# Patient Record
Sex: Male | Born: 1965 | Race: Black or African American | Hispanic: No | Marital: Single | Smoking: Never smoker
Health system: Southern US, Community
[De-identification: ages and names within clinical notes are randomized; demographics above are authoritative.]

## PROBLEM LIST (undated history)

## (undated) DIAGNOSIS — I10 Essential (primary) hypertension: Secondary | ICD-10-CM

## (undated) DIAGNOSIS — M109 Gout, unspecified: Secondary | ICD-10-CM

## (undated) HISTORY — PX: GALLBLADDER SURGERY: SHX652

---

## 2010-12-17 ENCOUNTER — Inpatient Hospital Stay (HOSPITAL_COMMUNITY)
Admission: EM | Admit: 2010-12-17 | Discharge: 2010-12-20 | DRG: 554 | Disposition: A | Discharge status: Home / Self Care | Payer: Self-pay | Attending: Internal Medicine | Admitting: Internal Medicine

## 2010-12-17 DIAGNOSIS — E871 Hypo-osmolality and hyponatremia: Secondary | ICD-10-CM | POA: Diagnosis not present

## 2010-12-17 DIAGNOSIS — R791 Abnormal coagulation profile: Secondary | ICD-10-CM | POA: Diagnosis present

## 2010-12-17 DIAGNOSIS — E119 Type 2 diabetes mellitus without complications: Secondary | ICD-10-CM | POA: Diagnosis present

## 2010-12-17 DIAGNOSIS — M109 Gout, unspecified: Principal | ICD-10-CM | POA: Diagnosis present

## 2010-12-17 DIAGNOSIS — D509 Iron deficiency anemia, unspecified: Secondary | ICD-10-CM | POA: Diagnosis present

## 2010-12-17 DIAGNOSIS — K449 Diaphragmatic hernia without obstruction or gangrene: Secondary | ICD-10-CM | POA: Diagnosis present

## 2010-12-17 DIAGNOSIS — Z9889 Other specified postprocedural states: Secondary | ICD-10-CM

## 2010-12-18 LAB — BASIC METABOLIC PANEL
BUN: 9 mg/dL (ref 6–23)
CO2: 26 mEq/L (ref 19–32)
Chloride: 100 mEq/L (ref 96–112)
Creatinine, Ser: 1 mg/dL (ref 0.50–1.35)
Glucose, Bld: 162 mg/dL — ABNORMAL HIGH (ref 70–99)

## 2010-12-18 LAB — CBC
HCT: 35.4 % — ABNORMAL LOW (ref 39.0–52.0)
HCT: 35.6 % — ABNORMAL LOW (ref 39.0–52.0)
Hemoglobin: 11.7 g/dL — ABNORMAL LOW (ref 13.0–17.0)
Hemoglobin: 11.8 g/dL — ABNORMAL LOW (ref 13.0–17.0)
MCH: 27.1 pg (ref 26.0–34.0)
MCH: 27.1 pg (ref 26.0–34.0)
MCV: 81.7 fL (ref 78.0–100.0)
MCV: 81.9 fL (ref 78.0–100.0)
RBC: 4.32 MIL/uL (ref 4.22–5.81)
RBC: 4.36 MIL/uL (ref 4.22–5.81)
WBC: 8.5 10*3/uL (ref 4.0–10.5)

## 2010-12-18 LAB — D-DIMER, QUANTITATIVE: D-Dimer, Quant: 1.5 ug/mL-FEU — ABNORMAL HIGH (ref 0.00–0.48)

## 2010-12-18 LAB — POCT I-STAT, CHEM 8
BUN: 10 mg/dL (ref 6–23)
Chloride: 104 mEq/L (ref 96–112)
Creatinine, Ser: 1.2 mg/dL (ref 0.50–1.35)
Sodium: 138 mEq/L (ref 135–145)

## 2010-12-18 LAB — DIFFERENTIAL
Lymphocytes Relative: 39 % (ref 12–46)
Lymphs Abs: 3.3 10*3/uL (ref 0.7–4.0)
Monocytes Relative: 8 % (ref 3–12)
Neutrophils Relative %: 50 % (ref 43–77)

## 2010-12-18 LAB — HEMOGLOBIN A1C: Hgb A1c MFr Bld: 6.7 % — ABNORMAL HIGH (ref <5.7)

## 2010-12-18 LAB — MRSA PCR SCREENING: MRSA by PCR: NEGATIVE

## 2010-12-19 DIAGNOSIS — M79609 Pain in unspecified limb: Secondary | ICD-10-CM

## 2010-12-19 LAB — DIFFERENTIAL
Basophils Absolute: 0 10*3/uL (ref 0.0–0.1)
Lymphocytes Relative: 48 % — ABNORMAL HIGH (ref 12–46)
Lymphs Abs: 3.8 10*3/uL (ref 0.7–4.0)
Monocytes Absolute: 0.6 10*3/uL (ref 0.1–1.0)
Neutro Abs: 3.4 10*3/uL (ref 1.7–7.7)

## 2010-12-19 LAB — BASIC METABOLIC PANEL
BUN: 10 mg/dL (ref 6–23)
Chloride: 102 mEq/L (ref 96–112)
GFR calc non Af Amer: >60 mL/min (ref >60)
Glucose, Bld: 100 mg/dL — ABNORMAL HIGH (ref 70–99)
Potassium: 4.1 mEq/L (ref 3.5–5.1)

## 2010-12-19 LAB — CBC
HCT: 34.9 % — ABNORMAL LOW (ref 39.0–52.0)
Hemoglobin: 11.4 g/dL — ABNORMAL LOW (ref 13.0–17.0)
MCHC: 32.7 g/dL (ref 30.0–36.0)
MCV: 81.5 fL (ref 78.0–100.0)

## 2010-12-19 LAB — IRON AND TIBC
Saturation Ratios: 13 % — ABNORMAL LOW (ref 20–55)
TIBC: 200 ug/dL — ABNORMAL LOW (ref 215–435)

## 2010-12-19 LAB — GLUCOSE, CAPILLARY: Glucose-Capillary: 140 mg/dL — ABNORMAL HIGH (ref 70–99)

## 2010-12-20 LAB — CBC
HCT: 35.1 % — ABNORMAL LOW (ref 39.0–52.0)
MCH: 26.6 pg (ref 26.0–34.0)
MCHC: 32.8 g/dL (ref 30.0–36.0)
MCV: 81.3 fL (ref 78.0–100.0)
RDW: 13.1 % (ref 11.5–15.5)

## 2010-12-26 LAB — GLUCOSE, CAPILLARY

## 2011-01-09 NOTE — H&P (Signed)
NAMEMarland Kitchen  Glover, Scott NO.:  [id number]  MEDICAL RECORD NO.:  [id number]  LOCATION:  1429                         FACILITY:  [facility]  PHYSICIAN:  Della Goo, M.D. DATE OF BIRTH:  Jul 15, 1965  DATE OF ADMISSION:  12/17/2010 DATE OF DISCHARGE:                             HISTORY & PHYSICAL   ADMISSION DATE:  December 18, 2010  PRIMARY CARE PHYSICIAN:  Unassigned.  Patient is from [place], [place].  CHIEF COMPLAINT:  Swelling, left lower leg.  HISTORY OF PRESENT ILLNESS:  This is a 45 year old male who presents to the emergency department secondary to complaint of swelling and pain in the left lower extremity, which has been worsening over the past 4-5 days.  Patient reports not having swelling in the leg before this.  He has had recent surgeries on a ventral hernia twice during the month of June, once on November 28, 2010, and complications developed requiring a repeat surgery on December 08, 2010.  The surgeries were performed in [place].  The patient denies having any fevers or chills.  He denies having any chest pain, shortness of breath, nausea, vomiting, or diarrhea.  Patient was seen and evaluated in the emergency department.  Laboratory studies did reveal an elevated D-dimer at 1.50; however, a venous duplex ultrasound study of the left lower leg was not able to be performed at this time of night.  Patient was started on full-dose Lovenox therapy and admitted for further evaluation and the venous ultrasound study.  PAST MEDICAL HISTORY:  Significant for: 1. History of acute pancreatitis in the past. 2. Also, ventral hernia. 3. Hiatal hernia. 4. Morbid obesity.  PAST SURGICAL HISTORY: 1. History of a cholecystectomy. 2. Cyst removal from his nose. 3. Bilateral inguinal hernia repairs as a child. 4. Ventral hernia repair x2 mentioned above.  MEDICATIONS:  Will need to be further verified.  ALLERGIES:  Patient is not sure of the medication he  is allergic to.  He believes the medications may end in [place].  This will need to be further verified.  SOCIAL HISTORY:  Patient is a nonsmoker, nondrinker.  No history of illicit drug usage.  FAMILY HISTORY:  Positive for hypertension in his father.  No history of coronary artery disease, diabetes, or cancer in his family that he knows of.  REVIEW OF SYSTEMS:  Pertinent as mentioned above.  PHYSICAL EXAMINATION FINDINGS:  GENERAL:  This is a morbidly obese 37- year-old [place] American male, who is in discomfort, but no acute distress. VITAL SIGNS:  Temperature 97.9, blood pressure 139/95, heart rate 95, respirations 18, O2 saturation 99%. HEENT EXAMINATION:  Normocephalic, atraumatic.  Pupils are equally round and reactive to light.  Extraocular movements are intact.  Funduscopic benign.  There is no scleral icterus.  Nares are patent bilaterally. Oropharynx is clear. NECK:  Supple.  Full range of motion.  No thyromegaly, adenopathy, or jugular venous distention. CARDIOVASCULAR:  Regular rate and rhythm.  No murmurs, gallops, or rubs appreciated. LUNGS:  Clear to auscultation bilaterally.  No rales, rhonchi, or wheezes. CHEST:  Chest wall:  No unusual masses, lesions, nontender to palpation. Breathing is unlabored and chest wall excursion is symmetric. ABDOMEN:  Positive bowel  sounds.  There is a surgical wound in the mid abdominal area, which is covered with the dressing and there is no purulent exudation from the wound at this time.  Patient also has evidence of laparoscopic surgical scars along his abdomen as well, which are healing. ABDOMEN:  Positive bowel sounds, soft, mildly tender at the central area around the wound, but no rebound, no guarding. EXTREMITIES:  2 to 3+ edema in the left lower extremity to the patellar area.  The calf is nontender.  There are no palpable cords. NEUROLOGIC EXAMINATION:  Nonfocal.  LABORATORY STUDIES:  White blood cell count 8.5,  hemoglobin 11.7, hematocrit 35.4, platelets 333,000.  Sodium 138, potassium 4.5, chloride 104, CO2 of 25, BUN 10, creatinine 1.20, glucose 118.  Uric acid 6.8.  D- dimer 1.50.  ASSESSMENT:  [age]-year-old male being admitted with: 1. Left lower extremity swelling, rule out deep venous thrombosis. 2. Surgical wound secondary to recent ventral hernia repairs. 3. Elevated D-dimer. 4. Anemia. 5. Morbid obesity.  PLAN:  Patient will be admitted for 23-hour observation and a venous ultrasound study of the left lower extremity will be ordered to evaluate for deep venous thrombosis, which the patient is at high risk.  Patient has been placed on full-dose Lovenox therapy until this is ruled out. Further therapies will be initiated once the diagnosis has been delineated.  Wound care has also been ordered for patient's continued care of the surgical wound of his ventral hernia.  Code status is a full code.  Patient's regular medications will need to be further reconciled. Pain control therapy has also been ordered as needed.    Della Goo, M.D.    HJ/MEDQ  D:  12/18/2010  T:  12/18/2010  Job:  [id number]  Electronically Signed by Della Goo M.D. on 01/09/2011 11:51:06 AM

## 2011-01-10 NOTE — Discharge Summary (Signed)
NAMEMarland Kitchen  Scott Glover, Scott Glover NO.:  [id number]  MEDICAL RECORD NO.:  [id number]  LOCATION:  1429                         FACILITY:  [facility]  PHYSICIAN:  Marcellus Scott, MD     DATE OF BIRTH:  Feb 27, 1966  DATE OF ADMISSION:  12/17/2010 DATE OF DISCHARGE:  12/20/2010                              DISCHARGE SUMMARY   PRIMARY CARE PHYSICIAN:  The patient does not have one.  DISCHARGE DIAGNOSES: 1. 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 2. Newly diagnosed type 2 diabetes mellitus. 3. Anemia, possibly chronic.  Outpatient evaluation. 4. Mild hyponatremia. 5. Recent abdominal hernia repair.  DISCHARGE MEDICATIONS: 1. Naprosyn 500 mg p.o. b.i.d. for 1-2 days then b.i.d. p.r.n. for     pain. 2. Multivitamins 1 tablet p.o. daily. 3. Tylenol 650 mg p.o. q.6 hourly p.r.n. for fever.  DISCONTINUED MEDICATIONS:  Clindamycin.  IMAGING: 1. Left lower extremity venous Doppler summary - no evidence of deep     vein or superficial thrombosis involving the left lower extremity     and right common femoral vein.  No evidence of Baker cyst on the     left.  LABORATORY DATA:  CBC today, hemoglobin 11.5, hematocrit 35.1, white blood cell 7.1, platelets 297.  Anemia panel shows iron 25, percentage saturation 13, vitamin B12 of 305, serum folate 15.1, ferritin 366. Basic metabolic panel yesterday only significant for sodium of 134, otherwise within normal limits.  Hemoglobin A1c 6.7.  MRSA PCR screening negative.  Serum uric acid was 6.8.  D-dimer was 1.50.  CONSULTATIONS:  None.  DIET:  Diabetic diet.  ACTIVITY:  Ad lib.  Wound care of the abdominal incision wound.  Change daily for 2 days then Mondays, Wednesdays, and Fridays. 1. Clean with normal saline and let it dry. 2. Place Aquacel Ag plus over incision only. 3. Cover with 2 x 2s and secure with tape.  COMPLAINTS TODAY:  The patient indicates that he is feeling much better. He actually denies pain  in the left knee or ankle and is ambulating the hallways comfortably.  PHYSICAL EXAMINATION:  GENERAL:  The patient is in no obvious distress. VITAL SIGNS:  Temperature 98.2 degrees Fahrenheit, pulse 90 per minute regular, respiration 14 per minute, blood pressure 127/90 mmHg, and saturating at 98% on room air. RESPIRATORY SYSTEM:  Clear. CARDIOVASCULAR SYSTEM:  First and second heart sounds heard, regular. ABDOMEN:  Nondistended, nontender, soft, and bowel sounds present. CENTRAL NERVOUS SYSTEM:  Patient is awake, alert, oriented x3 with no focal neurological deficits. EXTREMITIES:  With left knee and ankle with no further swelling or tenderness or warmth.  HOSPITAL COURSE:  Scott Glover is a 45 year old African American male patient who had a recent surgery for an abdominal ventral hernia on November 28, 2010, and December 08, 2010.  The surgeries were performed in [place]. He lives in [place].  He presented with pain in the left lower extremity for 4-5 days.  He was admitted as an observation status to rule out DVT in the left leg.  He was empirically started on full-dose Lovenox.  However, on interviewing the patient, the patient indicates that his pain was actually in the left  knee and left ankle.  He did give history of gout in the past.  His mom had history of DVT following surgery.  Once his Dopplers were done yesterday and DVT was ruled out, his pain in the left knee and ankle were suspicious for acute gouty arthritis.  He was started on Naprosyn as above.  He has done significantly well with resolution of his pain and he is ambulating comfortably.  Also as part of his evaluation in the hospital, he is found to be anemic which may be iron deficiency anemia which can be further evaluated as an outpatient.  Also his hyperglycemia yielded diagnosis of new type 2 diabetes mellitus by A1c criteria.  He has received diabetes education and is advised to buy a glucometer from one of the  retail pharmacies to have his blood sugar checks regularly and advised to diet and exercise and he would not need medications at this time.  His CBGs range between 96 and 140 mg/dL.  DISPOSITION:  The patient is discharged home in stable condition.  FOLLOWUP RECOMMENDATIONS: 1. The patient lives in [place] and is advised to follow up with     primary care physician of choice in the [place] area. 2. Follow up with his primary surgeon.  Time taken in coordinating this discharge is 30 minutes.     Marcellus Scott, MD     AH/MEDQ  D:  12/20/2010  T:  12/21/2010  Job:  [id number]  Electronically Signed by Marcellus Scott MD on 01/10/2011 09:24:26 PM

## 2011-10-13 ENCOUNTER — Emergency Department (HOSPITAL_COMMUNITY)
Admission: EM | Admit: 2011-10-13 | Discharge: 2011-10-13 | Disposition: A | Discharge status: Home / Self Care | Payer: Self-pay | Attending: Emergency Medicine | Admitting: Emergency Medicine

## 2011-10-13 ENCOUNTER — Encounter (HOSPITAL_COMMUNITY): Payer: Self-pay | Admitting: *Deleted

## 2011-10-13 ENCOUNTER — Emergency Department (HOSPITAL_COMMUNITY): Payer: Self-pay

## 2011-10-13 DIAGNOSIS — R072 Precordial pain: Secondary | ICD-10-CM | POA: Insufficient documentation

## 2011-10-13 DIAGNOSIS — R110 Nausea: Secondary | ICD-10-CM | POA: Insufficient documentation

## 2011-10-13 LAB — BASIC METABOLIC PANEL
BUN: 13 mg/dL (ref 6–23)
CO2: 24 mEq/L (ref 19–32)
Calcium: 9.2 mg/dL (ref 8.4–10.5)
Chloride: 106 mEq/L (ref 96–112)
Creatinine, Ser: 1.18 mg/dL (ref 0.50–1.35)
GFR calc Af Amer: 86 mL/min — ABNORMAL LOW (ref >90)
GFR calc non Af Amer: 74 mL/min — ABNORMAL LOW (ref >90)
Glucose, Bld: 106 mg/dL — ABNORMAL HIGH (ref 70–99)
Potassium: 4.2 mEq/L (ref 3.5–5.1)
Sodium: 139 mEq/L (ref 135–145)

## 2011-10-13 LAB — CBC
HCT: 40.3 % (ref 39.0–52.0)
Hemoglobin: 13.8 g/dL (ref 13.0–17.0)
MCH: 28.3 pg (ref 26.0–34.0)
MCHC: 34.2 g/dL (ref 30.0–36.0)
MCV: 82.8 fL (ref 78.0–100.0)
Platelets: 180 10*3/uL (ref 150–400)
RBC: 4.87 MIL/uL (ref 4.22–5.81)
RDW: 14.5 % (ref 11.5–15.5)
WBC: 5.8 10*3/uL (ref 4.0–10.5)

## 2011-10-13 LAB — TROPONIN I: Troponin I: <0.3 ng/mL (ref <0.30)

## 2011-10-13 MED ORDER — GI COCKTAIL ~~LOC~~
10.0000 mL | Freq: Once | ORAL | Status: AC
  Administered 2011-10-13: 30 mL via ORAL
  Filled 2011-10-13: qty 30

## 2011-10-13 MED ORDER — FAMOTIDINE 20 MG PO TABS
20.0000 mg | ORAL_TABLET | Freq: Once | ORAL | Status: AC
  Administered 2011-10-13: 20 mg via ORAL
  Filled 2011-10-13: qty 1

## 2011-10-13 MED ORDER — FAMOTIDINE 20 MG PO TABS: 20.0000 mg | ORAL_TABLET | Freq: Two times a day (BID) | ORAL | Status: DC

## 2011-10-13 NOTE — Discharge Instructions (Signed)
Chest Pain (Nonspecific) It is often hard to give a specific diagnosis for the cause of chest pain. There is always a chance that your pain could be related to something serious, such as a heart attack or a blood clot in the lungs. You need to follow up with your caregiver for further evaluation. CAUSES   Heartburn.   Pneumonia or bronchitis.   Anxiety or stress.   Inflammation around your heart (pericarditis) or lung (pleuritis or pleurisy).   A blood clot in the lung.   A collapsed lung (pneumothorax). It can develop suddenly on its own (spontaneous pneumothorax) or from injury (trauma) to the chest.   Shingles infection (herpes zoster virus).  The chest wall is composed of bones, muscles, and cartilage. Any of these can be the source of the pain.  The bones can be bruised by injury.   The muscles or cartilage can be strained by coughing or overwork.   The cartilage can be affected by inflammation and become sore (costochondritis).  DIAGNOSIS  Lab tests or other studies, such as X-rays, electrocardiography, stress testing, or cardiac imaging, may be needed to find the cause of your pain.  TREATMENT   Treatment depends on what may be causing your chest pain. Treatment may include:   Acid blockers for heartburn.   Anti-inflammatory medicine.   Pain medicine for inflammatory conditions.   Antibiotics if an infection is present.   You may be advised to change lifestyle habits. This includes stopping smoking and avoiding alcohol, caffeine, and chocolate.   You may be advised to keep your head raised (elevated) when sleeping. This reduces the chance of acid going backward from your stomach into your esophagus.   Most of the time, nonspecific chest pain will improve within 2 to 3 days with rest and mild pain medicine.  HOME CARE INSTRUCTIONS   If antibiotics were prescribed, take your antibiotics as directed. Finish them even if you start to feel better.   For the next few  days, avoid physical activities that bring on chest pain. Continue physical activities as directed.   Do not smoke.   Avoid drinking alcohol.   Only take over-the-counter or prescription medicine for pain, discomfort, or fever as directed by your caregiver.   Follow your caregiver's suggestions for further testing if your chest pain does not go away.   Keep any follow-up appointments you made. If you do not go to an appointment, you could develop lasting (chronic) problems with pain. If there is any problem keeping an appointment, you must call to reschedule.  SEEK MEDICAL CARE IF:   You think you are having problems from the medicine you are taking. Read your medicine instructions carefully.   Your chest pain does not go away, even after treatment.   You develop a rash with blisters on your chest.  SEEK IMMEDIATE MEDICAL CARE IF:   You have increased chest pain or pain that spreads to your arm, neck, jaw, back, or abdomen.   You develop shortness of breath, an increasing cough, or you are coughing up blood.   You have severe back or abdominal pain, feel nauseous, or vomit.   You develop severe weakness, fainting, or chills.   You have a fever.  THIS IS AN EMERGENCY. Do not wait to see if the pain will go away. Get medical help at once. Call your local emergency services (911 in [place] Do not drive yourself to the hospital. MAKE SURE YOU:   Understand these instructions.     Will watch your condition.   Will get help right away if you are not doing well or get worse.  Document Released: 03/15/2005 Document Revised: 05/25/2011 Document Reviewed: 01/09/2008 ExitCare Patient Information 2012 [employer]  RESOURCE GUIDE  Dental Problems  Patients with Medicaid: [facility]                      Dental [address]                                           [address] Phone:  [phone]                                                   Phone:  [phone]  If unable to pay or uninsured, contact:  [facility] or [facility] to become qualified for the adult dental clinic.  Chronic Pain Problems Contact [facility]  [phone] Patients need to be referred by their primary care doctor.  Insufficient Money for Medicine Contact [employer]:  call "211" or [facility] 271-5999.  No Primary Care Doctor Call [facility]  832-8000 Other agencies that provide inexpensive medical care    [facility]  832-8035    [facility]  832-7272    [facility]  [phone]    [facility] Clinic  832-4777    [facility]  [phone]    [facility]  [phone]  Psychological Services [facility]  [phone] [facility]  [phone] [facility]   800 853-5163 (emergency services 641-4993)  Substance Abuse Resources [facility]  336-882-2125 [facility] 336-784-9470 [facility] 336-285-9073 [facility] 336-845-3988 Residential & Outpatient Substance Abuse Program  800-659-3381  Abuse/Neglect [place] Child Abuse Hotline (336) 641-3795 [place] Child Abuse Hotline 800-378-5315 (After Hours)  Emergency Shelter [facility] (336) 271-5985  [facility] (336) 275-9566 [facility] (704) 372-4663  MRSA Hotline #:   832-7006    [place] [facility]  [facility] [place]     [employer]                          [facility] [address] [place]                       [address]      [address]  [place]                                                [place]                            [place] Phone:  [phone]                                     Phone:  [phone]                 Phone:  [phone]  [facility] Phone:   [phone]  [place] Child Abuse Hotline (336) 342-1394 (336) 342-3537 (After Hours)   

## 2011-10-13 NOTE — ED Notes (Signed)
Pt reports "chest pressure" on his left side underneath his breast that started two days ago.  He's been checking his BP and has increased over the past two days so he decided to come in.  The pressure is normally rated a 2/10 but increases when pt moves his right arm.  He has not taken any medications for the pain. Denies any sweating or SOB.

## 2011-10-13 NOTE — ED Provider Notes (Signed)
History    46 year old male with chest pain. Left lower sternal area.  Gradual onset 2 days ago. Relatively constant since onset. Pain is worse with movements. Patient reports that he has been burping more recently. Mild nausea. No vomiting. No shortness of breath. No palpitations. Size history of reflux or peptic ulcer disease. Denies history of known coronary artery disease. Has never had a stress test. No unusual leg pain or swelling. Denies hx of blood clot.  CSN: [id number]  Arrival date & time 10/13/11  4098   First MD Initiated Contact with Patient 10/13/11 [phone]      Chief Complaint  Patient presents with  . Chest Pain    (Consider location/radiation/quality/duration/timing/severity/associated sxs/prior treatment) HPI  History reviewed. No pertinent past medical history.  History reviewed. No pertinent past surgical history.  History reviewed. No pertinent family history.  History  Substance Use Topics  . Smoking status: Never Smoker   . Smokeless tobacco: Not on file  . Alcohol Use: No      Review of Systems   Review of symptoms negative unless otherwise noted in HPI.   Allergies  Review of patient's allergies indicates no known allergies.  Home Medications  No current outpatient prescriptions on file.  BP 125/79  Pulse 77  Temp(Src) 98.6 F (37 C) (Oral)  Resp 14  SpO2 100%  Physical Exam  Nursing note and vitals reviewed. Constitutional: He appears well-developed and well-nourished. No distress.       Sitting up in bed. No acute distress.  HENT:  Head: Normocephalic and atraumatic.  Right Ear: External ear normal.  Left Ear: External ear normal.  Eyes: Conjunctivae are normal. Pupils are equal, round, and reactive to light. Right eye exhibits no discharge. Left eye exhibits no discharge.  Neck: Neck supple.  Cardiovascular: Normal rate, regular rhythm and normal heart sounds.  Exam reveals no gallop and no friction rub.   No murmur  heard. Pulmonary/Chest: Effort normal and breath sounds normal. No respiratory distress. He exhibits no tenderness.       Chest pain not reproducible. No overlying skin changes in area of pain.  Abdominal: Soft. He exhibits no distension. There is no tenderness.       Well-healed laparoscopic incisions. No distention. No tenderness. No mass palpated.  Musculoskeletal: He exhibits no edema and no tenderness.  Neurological: He is alert.  Skin: Skin is warm and dry. He is not diaphoretic.  Psychiatric: He has a normal mood and affect. His behavior is normal. Thought content normal.    ED Course  Procedures (including critical care time)  Labs Reviewed  BASIC METABOLIC PANEL - Abnormal; Notable for the following:    Glucose, Bld 106 (*)    GFR calc non Af Amer 74 (*)    GFR calc Af Amer 86 (*)    All other components within normal limits  CBC  TROPONIN I   Dg Chest 2 View  10/13/2011  *RADIOLOGY REPORT*  Clinical Data: Nausea and chest pain.  CHEST - 2 VIEW  Comparison: No priors.  Findings: Lung volumes are slightly low.  No consolidative airspace disease.  No pleural effusions.  No pneumothorax.  No pulmonary nodule or mass noted.  Pulmonary vasculature and the cardiomediastinal silhouette are within normal limits.  IMPRESSION: 1. No radiographic evidence of acute cardiopulmonary disease.  Original Report Authenticated By: Florencia Reasons, M.D.    EKG:  Rhythm: nsr Rate: 67 Axis:normal Intervals: normal ST segments:likely j point elevation v2/3. Notching. Concave up.  No reciprical changes. Comparison: None   1. Chest pain       MDM  46 year old male with left-sided chest pain consider ACS but pain is atypical given constant nature for almost 2 days andexacerbation with movement. Possible GI etiology with increased belching and nausea. EKG is non-provocative. Troponin is normal. Chest x-ray is clear. Patient has no respiratory complaints, clear lungs, no increased work of  breathing and oxygen saturations are good on room air. Doubt pulmonary embolism or pneumonia. Possible musculoskeletal with worsening with movement, but there was no antecedent trauma or overuse. Discussed with patient that although I think that a cardiac etiology of his pain is less likely, I cannot completely excluded at this time. Patient does not want to stay. He would like to go at this time. Concerned that patient has no reliable outpatient followup. He has no insurance. Will give list of resources. Does not want to speak with CM/social worker prior to DC.    Raeford Razor, MD 10/17/11 2158

## 2011-10-13 NOTE — ED Notes (Signed)
Pt reports constant left sided chest pressure since Wednesday, states pressure increases with movement, reports belching and nausea. Denies sob or diaphoresis.

## 2016-05-04 ENCOUNTER — Emergency Department (HOSPITAL_COMMUNITY): Payer: Self-pay

## 2016-05-04 ENCOUNTER — Emergency Department (HOSPITAL_COMMUNITY)
Admission: EM | Admit: 2016-05-04 | Discharge: 2016-05-04 | Disposition: A | Discharge status: Home / Self Care | Payer: Self-pay | Attending: Emergency Medicine | Admitting: Emergency Medicine

## 2016-05-04 ENCOUNTER — Encounter (HOSPITAL_COMMUNITY): Payer: Self-pay | Admitting: Emergency Medicine

## 2016-05-04 DIAGNOSIS — M109 Gout, unspecified: Secondary | ICD-10-CM | POA: Insufficient documentation

## 2016-05-04 MED ORDER — COLCHICINE 0.6 MG PO TABS: 0.6000 mg | ORAL_TABLET | Freq: Two times a day (BID) | ORAL | 0 refills | Status: DC

## 2016-05-04 NOTE — Discharge Instructions (Signed)
Follow up with a primary care doctor to make sure your are improving, return for fever, increasing redness, worsening symptoms

## 2016-05-04 NOTE — ED Provider Notes (Signed)
[facility]-EMERGENCY DEPT Provider Note   CSN: [id number] Arrival date & time: 05/04/16  2042     History   Chief Complaint Chief Complaint  Patient presents with  . Ankle Pain    HPI Scott Glover is a 50 y.o. male.  HPI Patient presents to the emergency room with complaints of left ankle pain.  Patient noticed pain started a few weeks ago. Since that time he's had waxing and waning swelling of the lateral aspect of the left ankle. He is also had increasing pain. The pain increases with any movement or walking. Patient denies any trouble with any fevers or chills.  No chest pain or shortness of breath. He does have history of gout but it has never affected his ankle previously.  History reviewed. No pertinent past medical history.  There are no active problems to display for this patient.   Past Surgical History:  Procedure Laterality Date  . GALLBLADDER SURGERY         Home Medications    Prior to Admission medications   Medication Sig Start Date End Date Taking? Authorizing Provider  colchicine 0.6 MG tablet Take 1 tablet (0.6 mg total) by mouth 2 (two) times daily. 05/04/16   Linwood DibblesJon Jamecia Lerman, MD  famotidine (PEPCID) 20 MG tablet Take 1 tablet (20 mg total) by mouth 2 (two) times daily. 10/13/11 10/12/12  Raeford RazorStephen Kohut, MD    Family History No family history on file.  Social History Social History  Substance Use Topics  . Smoking status: Never Smoker  . Smokeless tobacco: Not on file  . Alcohol use No     Allergies   Patient has no known allergies.   Review of Systems Review of Systems  All other systems reviewed and are negative.    Physical Exam Updated Vital Signs BP 138/91   Pulse 81   Temp 98.2 F (36.8 C) (Oral)   Resp 16   SpO2 100%   Physical Exam  Constitutional: He appears well-developed and well-nourished. No distress.  HENT:  Head: Normocephalic and atraumatic.  Right Ear: External ear normal.  Left Ear: External ear normal.  Eyes:  Conjunctivae are normal. Right eye exhibits no discharge. Left eye exhibits no discharge. No scleral icterus.  Neck: Neck supple. No tracheal deviation present.  Cardiovascular: Normal rate.   Pulmonary/Chest: Effort normal. No stridor. No respiratory distress.  Abdominal: He exhibits no distension.  Musculoskeletal: He exhibits edema.  Pitting Edema bilateral lower extremities without erythema, patient has mild erythema of the lateral aspect of the left ankle, tenderness palpation of the lateral malleolus, no foot edema or tenderness edema or tenderness  Neurological: He is alert. Cranial nerve deficit: no gross deficits.  Skin: Skin is warm and dry. No rash noted.  Psychiatric: He has a normal mood and affect.  Nursing note and vitals reviewed.    ED Treatments / Results   Radiology Dg Ankle Complete Left  Result Date: 05/04/2016 CLINICAL DATA:  50 y/o M; left lateral malleolus ankle pain and swelling for 3 weeks. EXAM: LEFT ANKLE COMPLETE - 3+ VIEW COMPARISON:  None. FINDINGS: No acute fracture or dislocation identified. Talar dome is intact. Ankle mortise is symmetric on these nonstress views. Dorsal and plantar calcaneal enthesophytes. Intertarsal osteoarthrosis with small marginal osteophytes. Vascular calcifications. Soft tissue swelling about the ankle joint greatest over the lateral malleolus. IMPRESSION: No acute fracture or dislocation identified. Soft tissue swelling about the ankle joint greatest over lateral malleolus. Electronically Signed   By: Micah NoelLance  Furusawa-Stratton M.D.   On: 05/04/2016 22:37    Procedures Procedures (including critical care time)  Medications Ordered in ED Medications - No data to display   Initial Impression / Assessment and Plan / ED Course  I have reviewed the triage vital signs and the nursing notes.  Pertinent labs & imaging results that were available during my care of the patient were reviewed by me and considered in my medical decision  making (see chart for details).  Clinical Course    Patient presented to the emergency room with complaints of left ankle pain. He does have bilateral peripheral edema but this is not a new issue for him and he does use compression stockings. Patient has focal tenderness in the lateral aspect of his left ankle.  I suspect patient's symptoms may be related to a gout attack. He has had trouble with gout in his toes before. I doubt septic arthritis. I doubt DVT or cellulitis. Plan on discharge home with colchicine. I recommend follow-up with primary doctor to follow up on his gout attack and his chronic peripheral edema  Final Clinical Impressions(s) / ED Diagnoses   Final diagnoses:  Acute gout of left ankle, unspecified cause    New Prescriptions New Prescriptions   COLCHICINE 0.6 MG TABLET    Take 1 tablet (0.6 mg total) by mouth 2 (two) times daily.     Linwood DibblesJon Adylynn Hertenstein, MD 05/04/16 [phone]

## 2016-05-04 NOTE — ED Triage Notes (Signed)
Pt states "My left ankle started swelling about three weeks ago, and I just cant keep the swelling down, every time I got to sleep it goes down a bit, but during the day it get so lartge I can barely walk". Pt states because hes been putting a lot of weight on his R foot, his right toe is now tender. Pt ambulatory with limp. Pt denies injury.

## 2018-03-03 ENCOUNTER — Emergency Department (HOSPITAL_COMMUNITY)
Admission: EM | Admit: 2018-03-03 | Discharge: 2018-03-04 | Disposition: A | Discharge status: Home / Self Care | Payer: Self-pay | Attending: Emergency Medicine | Admitting: Emergency Medicine

## 2018-03-03 ENCOUNTER — Other Ambulatory Visit: Payer: Self-pay

## 2018-03-03 ENCOUNTER — Emergency Department (HOSPITAL_COMMUNITY): Payer: Self-pay

## 2018-03-03 ENCOUNTER — Encounter (HOSPITAL_COMMUNITY): Payer: Self-pay | Admitting: Emergency Medicine

## 2018-03-03 DIAGNOSIS — W208XXA Other cause of strike by thrown, projected or falling object, initial encounter: Secondary | ICD-10-CM | POA: Insufficient documentation

## 2018-03-03 DIAGNOSIS — Y999 Unspecified external cause status: Secondary | ICD-10-CM | POA: Insufficient documentation

## 2018-03-03 DIAGNOSIS — Y929 Unspecified place or not applicable: Secondary | ICD-10-CM | POA: Insufficient documentation

## 2018-03-03 DIAGNOSIS — Y939 Activity, unspecified: Secondary | ICD-10-CM | POA: Insufficient documentation

## 2018-03-03 DIAGNOSIS — S99921A Unspecified injury of right foot, initial encounter: Secondary | ICD-10-CM

## 2018-03-03 DIAGNOSIS — Z79899 Other long term (current) drug therapy: Secondary | ICD-10-CM | POA: Insufficient documentation

## 2018-03-03 NOTE — ED Triage Notes (Signed)
Pt dropped his wife's portable wheelchair ramp on his R great toe/foot on Tuesday.  C/o pain and swelling.

## 2018-03-04 MED ORDER — DICLOFENAC SODIUM 1 % TD GEL: 2.0000 g | Freq: Four times a day (QID) | TRANSDERMAL | 0 refills | Status: DC

## 2018-03-04 NOTE — Discharge Instructions (Addendum)
You have been seen today for a foot injury. There were no acute abnormalities on the x-rays, including no sign of fracture or dislocation, however, there could be injuries to the soft tissues, such as the ligaments or tendons that are not seen on xrays. There could also be what are called occult fractures that are small fractures not seen on xray. Antiinflammatory medications: Take 600 mg of ibuprofen every 6 hours or 440 mg (over the counter dose) to 500 mg (prescription dose) of naproxen every 12 hours for the next 3 days. After this time, these medications may be used as needed for pain. Take these medications with food to avoid upset stomach. Choose only one of these medications, do not take them together. Acetaminophen (generic for Tylenol): Should you continue to have additional pain while taking the ibuprofen or naproxen, you may add in acetaminophen as needed. Your daily total maximum amount of acetaminophen from all sources should be limited to 4000mg /day for persons without liver problems, or 2000mg /day for those with liver problems. Diclofenac gel: As an alternative to the above regimen, may use the diclofenac gel. Ice: May apply ice to the area over the next 24 hours for 15 minutes at a time to reduce swelling. Elevation: Keep the extremity elevated as often as possible to reduce pain and inflammation. Support: Wear the post op shoe for support and comfort. Wear this until pain resolves. You will be weight-bearing as tolerated, which means you can slowly start to put weight on the extremity and increase amount and frequency as pain allows. Follow up: Follow-up with the podiatry or orthopedic specialist. Return: Return to the ED for numbness, weakness, increasing pain, overall worsening symptoms, loss of function, or if symptoms are not improving, you have tried to follow up with the orthopedic specialist, and have been unable to do so.

## 2018-03-04 NOTE — ED Provider Notes (Signed)
[facility] EMERGENCY DEPARTMENT Provider Note   CSN: [id number] Arrival date & time: 03/03/18  2302     History   Chief Complaint Chief Complaint  Patient presents with  . Foot Pain    HPI Scott Glover is a 52 y.o. male.  HPI   Scott Glover is a 52 y.o. male, patient with no pertinent past medical history, presenting to the ED with right foot and big toe injury that occurred about 5 days ago.  States he dropped his wife's portable wheelchair ramp on the foot.  Pain is throbbing, moderate to severe, nonradiating.  Denies numbness, weakness, falls, ankle pain, or any other complaints.  History reviewed. No pertinent past medical history.  There are no active problems to display for this patient.   Past Surgical History:  Procedure Laterality Date  . GALLBLADDER SURGERY          Home Medications    Prior to Admission medications   Medication Sig Start Date End Date Taking? Authorizing Provider  colchicine 0.6 MG tablet Take 1 tablet (0.6 mg total) by mouth 2 (two) times daily. 05/04/16   Linwood Dibbles, MD  famotidine (PEPCID) 20 MG tablet Take 1 tablet (20 mg total) by mouth 2 (two) times daily. 10/13/11 10/12/12  Raeford Razor, MD    Family History No family history on file.  Social History Social History   Tobacco Use  . Smoking status: Never Smoker  . Smokeless tobacco: Never Used  Substance Use Topics  . Alcohol use: No  . Drug use: Never     Allergies   Patient has no known allergies.   Review of Systems Review of Systems  Musculoskeletal: Positive for arthralgias.  Neurological: Negative for weakness and numbness.     Physical Exam Updated Vital Signs BP (!) 168/103   Pulse 87   Temp 98.9 F (37.2 C)   Resp 18   SpO2 98%   Physical Exam  Constitutional: He appears well-developed and well-nourished. No distress.  HENT:  Head: Normocephalic and atraumatic.  Eyes: Conjunctivae are normal.  Neck: Neck supple.    Cardiovascular: Normal rate, regular rhythm and intact distal pulses.  Pulmonary/Chest: Effort normal.  Musculoskeletal: He exhibits tenderness. He exhibits no deformity.  Tenderness to the dorsal right big toe and just proximal to the MTP joint.  Flexion and extension intact.  Neurological: He is alert.  Sensation to light touch grossly intact in the right foot and toes. Flexion and extension of the right big toe intact against resistance.  Skin: Skin is warm and dry. Capillary refill takes less than 2 seconds. He is not diaphoretic. No pallor.  Psychiatric: He has a normal mood and affect. His behavior is normal.  Nursing note and vitals reviewed.    ED Treatments / Results  Labs (all labs ordered are listed, but only abnormal results are displayed) Labs Reviewed - No data to display  EKG None  Radiology Dg Foot Complete Right  Result Date: 03/03/2018 CLINICAL DATA:  Dropped wheelchair on foot five days ago. Pain and swelling. EXAM: RIGHT FOOT COMPLETE - 3+ VIEW COMPARISON:  None. FINDINGS: AP radiograph is moderately motion degraded. No fracture deformity or dislocation. Moderate hallux valgus with laterally subluxed first metatarsal sesamoids. Mild first MTP joint space narrowing and marginal spurring compatible with osteoarthrosis. Mild midfoot osteoarthrosis. Suspected pes planus on this nonweightbearing examination. Dorsal mid and forefoot soft tissue swelling without subcutaneous gas or radiopaque foreign bodies. IMPRESSION: Soft tissue swelling without  acute osseous process. Electronically Signed   By: Courtnay  Bloomer M.D.  Awilda Metro On: 03/03/2018 23:34    Procedures Procedures (including critical care time)  Medications Ordered in ED Medications - No data to display   Initial Impression / Assessment and Plan / ED Course  I have reviewed the triage vital signs and the nursing notes.  Pertinent labs & imaging results that were available during my care of the patient were  reviewed by me and considered in my medical decision making (see chart for details).     Patient presents with a right foot and toe injury.  No acute abnormality on x-ray.  Neurovascularly intact. The patient was given instructions for home care as well as return precautions. Patient voices understanding of these instructions, accepts the plan, and is comfortable with discharge.   Patient noted to be hypertensive today.  Appears to be asymptomatic to it.  Recommend PCP follow-up on this matter.  Final Clinical Impressions(s) / ED Diagnoses   Final diagnoses:  Injury of right foot, initial encounter    ED Discharge Orders    None       Anselm PancoastJoy, Shawn C, PA-C 03/04/18 0124    Ward, Layla MawKristen N, DO 03/04/18 0236

## 2019-03-26 ENCOUNTER — Other Ambulatory Visit: Payer: Self-pay

## 2019-03-26 DIAGNOSIS — Z20822 Contact with and (suspected) exposure to covid-19: Secondary | ICD-10-CM

## 2019-03-28 LAB — NOVEL CORONAVIRUS, NAA: SARS-CoV-2, NAA: NOT DETECTED

## 2019-06-25 ENCOUNTER — Ambulatory Visit: Payer: No Typology Code available for payment source | Attending: Family Medicine

## 2019-06-25 DIAGNOSIS — Z20822 Contact with and (suspected) exposure to covid-19: Secondary | ICD-10-CM

## 2019-06-26 LAB — NOVEL CORONAVIRUS, NAA: SARS-CoV-2, NAA: DETECTED — AB

## 2019-06-27 ENCOUNTER — Emergency Department (HOSPITAL_COMMUNITY)
Admission: EM | Admit: 2019-06-27 | Discharge: 2019-06-27 | Disposition: A | Discharge status: Home / Self Care | Payer: No Typology Code available for payment source | Attending: Emergency Medicine | Admitting: Emergency Medicine

## 2019-06-27 ENCOUNTER — Encounter (HOSPITAL_COMMUNITY): Payer: Self-pay | Admitting: *Deleted

## 2019-06-27 DIAGNOSIS — I10 Essential (primary) hypertension: Secondary | ICD-10-CM | POA: Insufficient documentation

## 2019-06-27 DIAGNOSIS — Z20822 Contact with and (suspected) exposure to covid-19: Secondary | ICD-10-CM | POA: Diagnosis not present

## 2019-06-27 DIAGNOSIS — R509 Fever, unspecified: Secondary | ICD-10-CM | POA: Diagnosis not present

## 2019-06-27 DIAGNOSIS — U071 COVID-19: Secondary | ICD-10-CM

## 2019-06-27 DIAGNOSIS — R531 Weakness: Secondary | ICD-10-CM | POA: Diagnosis not present

## 2019-06-27 DIAGNOSIS — R438 Other disturbances of smell and taste: Secondary | ICD-10-CM | POA: Diagnosis not present

## 2019-06-27 DIAGNOSIS — R05 Cough: Secondary | ICD-10-CM | POA: Diagnosis not present

## 2019-06-27 DIAGNOSIS — R112 Nausea with vomiting, unspecified: Secondary | ICD-10-CM | POA: Diagnosis not present

## 2019-06-27 DIAGNOSIS — Z79899 Other long term (current) drug therapy: Secondary | ICD-10-CM | POA: Insufficient documentation

## 2019-06-27 HISTORY — DX: Essential (primary) hypertension: I10

## 2019-06-27 HISTORY — DX: Gout, unspecified: M10.9

## 2019-06-27 LAB — CBC WITH DIFFERENTIAL/PLATELET
Abs Immature Granulocytes: 0 10*3/uL (ref 0.00–0.07)
Basophils Absolute: 0 10*3/uL (ref 0.0–0.1)
Basophils Relative: 0 %
Eosinophils Absolute: 0 10*3/uL (ref 0.0–0.5)
Eosinophils Relative: 0 %
HCT: 44.1 % (ref 39.0–52.0)
Hemoglobin: 14.2 g/dL (ref 13.0–17.0)
Immature Granulocytes: 0 %
Lymphocytes Relative: 35 %
Lymphs Abs: 1.1 10*3/uL (ref 0.7–4.0)
MCH: 27.6 pg (ref 26.0–34.0)
MCHC: 32.2 g/dL (ref 30.0–36.0)
MCV: 85.8 fL (ref 80.0–100.0)
Monocytes Absolute: 0.2 10*3/uL (ref 0.1–1.0)
Monocytes Relative: 7 %
Neutro Abs: 1.9 10*3/uL (ref 1.7–7.7)
Neutrophils Relative %: 58 %
Platelets: 117 10*3/uL — ABNORMAL LOW (ref 150–400)
RBC: 5.14 MIL/uL (ref 4.22–5.81)
RDW: 13.9 % (ref 11.5–15.5)
WBC: 3.3 10*3/uL — ABNORMAL LOW (ref 4.0–10.5)
nRBC: 0 % (ref 0.0–0.2)

## 2019-06-27 LAB — COMPREHENSIVE METABOLIC PANEL
ALT: 22 U/L (ref 0–44)
AST: 30 U/L (ref 15–41)
Albumin: 3.6 g/dL (ref 3.5–5.0)
Alkaline Phosphatase: 25 U/L — ABNORMAL LOW (ref 38–126)
Anion gap: 10 (ref 5–15)
BUN: 9 mg/dL (ref 6–20)
CO2: 25 mmol/L (ref 22–32)
Calcium: 8.6 mg/dL — ABNORMAL LOW (ref 8.9–10.3)
Chloride: 100 mmol/L (ref 98–111)
Creatinine, Ser: 1.1 mg/dL (ref 0.61–1.24)
GFR calc Af Amer: >60 mL/min (ref >60)
GFR calc non Af Amer: >60 mL/min (ref >60)
Glucose, Bld: 120 mg/dL — ABNORMAL HIGH (ref 70–99)
Potassium: 4.1 mmol/L (ref 3.5–5.1)
Sodium: 135 mmol/L (ref 135–145)
Total Bilirubin: 0.5 mg/dL (ref 0.3–1.2)
Total Protein: 7.1 g/dL (ref 6.5–8.1)

## 2019-06-27 LAB — LIPASE, BLOOD: Lipase: 75 U/L — ABNORMAL HIGH (ref 11–51)

## 2019-06-27 MED ORDER — ONDANSETRON HCL 4 MG/2ML IJ SOLN
4.0000 mg | Freq: Once | INTRAMUSCULAR | Status: AC
  Administered 2019-06-27: 13:00:00 4 mg via INTRAVENOUS
  Filled 2019-06-27: qty 2

## 2019-06-27 MED ORDER — SODIUM CHLORIDE 0.9 % IV BOLUS
500.0000 mL | Freq: Once | INTRAVENOUS | Status: AC
  Administered 2019-06-27: 13:00:00 500 mL via INTRAVENOUS

## 2019-06-27 MED ORDER — ONDANSETRON HCL 4 MG PO TABS: 4.0000 mg | ORAL_TABLET | Freq: Four times a day (QID) | ORAL | 0 refills | Status: DC

## 2019-06-27 NOTE — Discharge Instructions (Signed)
Your symptoms are likely due to the coronavirus. Use Zofran as needed for nausea or vomiting.  Make sure you are staying well-hydrated water. Continue to treat symptomatically.  Use Tylenol, ibuprofen as needed for fever, body aches, or pain. Use cough medicine as needed. Return to the emergency room if you develop persistent vomiting despite medication, increased difficulty breathing, or any new, worsening, or concerning symptoms.

## 2019-06-27 NOTE — ED Notes (Signed)
Pt's SpO2 98-100% when ambulating in room

## 2019-06-27 NOTE — ED Notes (Signed)
Patient verbalizes understanding of discharge instructions. Opportunity for questioning and answers were provided. Armband removed by staff, pt discharged from ED.  

## 2019-06-27 NOTE — ED Triage Notes (Signed)
Pt is here with wife who is here for same symptoms.  Pt began feeling fatigue and fever and chills on Saturday.  Pt had a covid test on Wednesday which he found out was positive today.  Pt has had severe fatigue, loss of sense of taste and smell.  Pt continues to have nausea, vomiting, loss of sense of taste and smell, fever (up to 101F) at home, body aches.  No sob.

## 2019-06-27 NOTE — ED Notes (Signed)
Pt able to tolerate water. Denying any nausea/vomiting at present time.

## 2019-06-27 NOTE — ED Provider Notes (Signed)
[facility] EMERGENCY DEPARTMENT Provider Note   CSN: [id number] Arrival date & time: 06/27/19  1050     History Chief Complaint  Patient presents with  . Covid +    Scott Glover is a 54 y.o. male presenting for evaluation of nausea and vomiting.  Patient states he started to have Covid symptoms 1 week ago.  He reports fevers and chills, mild cough, loss of taste and smell.  Patient states today he developed nausea and vomiting, has thrown up multiple times.  He denies hematemesis.  He denies chest pain, shortness of breath, abdominal pain, urine symptoms, abnormal bowel movements.  Patient states he is here because he cannot stop vomiting.  He has not had anything to eat or drink today.  He has not taken anything for symptoms today.  His wife is also sick with Covid-like symptoms.  He reports a history of high blood pressure.  She takes medication, no other medical problems.  HPI     Past Medical History:  Diagnosis Date  . Gout   . Hypertension     There are no problems to display for this patient.   Past Surgical History:  Procedure Laterality Date  . GALLBLADDER SURGERY         No family history on file.  Social History   Tobacco Use  . Smoking status: Never Smoker  . Smokeless tobacco: Never Used  Substance Use Topics  . Alcohol use: No  . Drug use: Never    Home Medications Prior to Admission medications   Medication Sig Start Date End Date Taking? Authorizing Provider  colchicine 0.6 MG tablet Take 1 tablet (0.6 mg total) by mouth 2 (two) times daily. 05/04/16   Linwood Dibbles, MD  diclofenac sodium (VOLTAREN) 1 % GEL Apply 2 g topically 4 (four) times daily. 03/04/18   Joy, Shawn C, PA-C  famotidine (PEPCID) 20 MG tablet Take 1 tablet (20 mg total) by mouth 2 (two) times daily. 10/13/11 10/12/12  Raeford Razor, MD  ondansetron (ZOFRAN) 4 MG tablet Take 1 tablet (4 mg total) by mouth every 6 (six) hours. 06/27/19   Ambree Frances, PA-C    Allergies    Patient has no known allergies.  Review of Systems   Review of Systems  Respiratory: Positive for cough.   Gastrointestinal: Positive for nausea and vomiting.  Neurological: Positive for weakness.  All other systems reviewed and are negative.   Physical Exam Updated Vital Signs BP 115/71   Pulse 81   Temp 100.3 F (37.9 C) (Oral)   Resp 16   Wt 111.1 kg   SpO2 96%   Physical Exam Vitals and nursing note reviewed.  Constitutional:      General: He is not in acute distress.    Appearance: He is well-developed.     Comments: Resting comfortably in the bed in no acute distress  HENT:     Head: Normocephalic and atraumatic.  Eyes:     Extraocular Movements: Extraocular movements intact.     Conjunctiva/sclera: Conjunctivae normal.     Pupils: Pupils are equal, round, and reactive to light.  Cardiovascular:     Rate and Rhythm: Normal rate and regular rhythm.     Pulses: Normal pulses.  Pulmonary:     Effort: Pulmonary effort is normal. No respiratory distress.     Breath sounds: Normal breath sounds. No wheezing.     Comments: Speaking in full sentences.  Clear lung sounds in all fields.  No signs of respiratory distress or accessory muscle use.  Sats stable on room air. Abdominal:     General: There is no distension.     Palpations: Abdomen is soft. There is no mass.     Tenderness: There is no abdominal tenderness. There is no guarding or rebound.     Comments: No tenderness palpation the abdomen.  Soft without rigidity, guarding, distention.  Negative rebound.  No signs of peritonitis.  Musculoskeletal:        General: Normal range of motion.     Cervical back: Normal range of motion and neck supple.     Right lower leg: No edema.     Left lower leg: No edema.  Skin:    General: Skin is warm and dry.     Capillary Refill: Capillary refill takes less than 2 seconds.  Neurological:     Mental Status: He is alert and oriented to person, place, and  time.     ED Results / Procedures / Treatments   Labs (all labs ordered are listed, but only abnormal results are displayed) Labs Reviewed  CBC WITH DIFFERENTIAL/PLATELET - Abnormal; Notable for the following components:      Result Value   WBC 3.3 (*)    Platelets 117 (*)    All other components within normal limits  COMPREHENSIVE METABOLIC PANEL - Abnormal; Notable for the following components:   Glucose, Bld 120 (*)    Calcium 8.6 (*)    Alkaline Phosphatase 25 (*)    All other components within normal limits  LIPASE, BLOOD - Abnormal; Notable for the following components:   Lipase 75 (*)    All other components within normal limits    EKG None  Radiology No results found.  Procedures Procedures (including critical care time)  Medications Ordered in ED Medications  ondansetron (ZOFRAN) injection 4 mg (4 mg Intravenous Given 06/27/19 1317)  sodium chloride 0.9 % bolus 500 mL (0 mLs Intravenous Stopped 06/27/19 1413)    ED Course  I have reviewed the triage vital signs and the nursing notes.  Pertinent labs & imaging results that were available during my care of the patient were reviewed by me and considered in my medical decision making (see chart for details).    MDM Rules/Calculators/A&P                      Patient presenting for evaluation of nausea and vomiting in the setting of Covid.  Physical examination, he appears nontoxic.  No vomiting noted during my exam.  Abdominal exam is reassuring, no tenderness.  Doubt intra-abdominal infection, perforation, obstruction, surgical abdomen.  However due to his weakness and persistent vomiting, will obtain labs to ensure no renal or electrolyte abnormality.  Pulmonary exam is reassuring, I do not believe he will need admission for Covid due to his respiratory status.  Labs overall reassuring.  Slight leukopenia of 3.3, likely due to Covid.  Lipase mildly elevated at 75, however without abdominal tenderness, doubt acute  pancreatitis.  On reassessment after 500 cc of fluid and Zofran, patient reports nausea is resolved.  He is tolerating p.o. without difficulty.  He states he is feeling better.  I discussed continued symptomatic treatment at home and importance of quarantine.  Discussed return if symptoms worsen.  At this time, patient appears safe for discharge.  Return precautions given.  Patient states he understands and agrees to plan.  Final Clinical Impression(s) / ED Diagnoses Final diagnoses:  COVID-19  Non-intractable vomiting with nausea, unspecified vomiting type    Rx / DC Orders ED Discharge Orders         Ordered    ondansetron (ZOFRAN) 4 MG tablet  Every 6 hours     06/27/19 1505           Shakeisha Horine, PA-C 06/27/19 1603    Terrilee Files, MD 06/27/19 1745

## 2019-06-30 ENCOUNTER — Emergency Department (HOSPITAL_COMMUNITY)
Admission: EM | Admit: 2019-06-30 | Discharge: 2019-07-01 | Disposition: A | Discharge status: Home / Self Care | Payer: No Typology Code available for payment source | Attending: Emergency Medicine | Admitting: Emergency Medicine

## 2019-06-30 ENCOUNTER — Encounter (HOSPITAL_COMMUNITY): Payer: Self-pay | Admitting: Emergency Medicine

## 2019-06-30 ENCOUNTER — Emergency Department (HOSPITAL_COMMUNITY): Payer: No Typology Code available for payment source

## 2019-06-30 ENCOUNTER — Other Ambulatory Visit: Payer: Self-pay

## 2019-06-30 DIAGNOSIS — R531 Weakness: Secondary | ICD-10-CM | POA: Diagnosis not present

## 2019-06-30 DIAGNOSIS — I10 Essential (primary) hypertension: Secondary | ICD-10-CM | POA: Insufficient documentation

## 2019-06-30 DIAGNOSIS — U071 COVID-19: Secondary | ICD-10-CM | POA: Diagnosis not present

## 2019-06-30 DIAGNOSIS — R509 Fever, unspecified: Secondary | ICD-10-CM | POA: Insufficient documentation

## 2019-06-30 DIAGNOSIS — R1110 Vomiting, unspecified: Secondary | ICD-10-CM | POA: Diagnosis present

## 2019-06-30 LAB — COMPREHENSIVE METABOLIC PANEL
ALT: 26 U/L (ref 0–44)
AST: 40 U/L (ref 15–41)
Albumin: 3.4 g/dL — ABNORMAL LOW (ref 3.5–5.0)
Alkaline Phosphatase: 24 U/L — ABNORMAL LOW (ref 38–126)
Anion gap: 11 (ref 5–15)
BUN: 10 mg/dL (ref 6–20)
CO2: 24 mmol/L (ref 22–32)
Calcium: 8.5 mg/dL — ABNORMAL LOW (ref 8.9–10.3)
Chloride: 101 mmol/L (ref 98–111)
Creatinine, Ser: 1.15 mg/dL (ref 0.61–1.24)
GFR calc Af Amer: >60 mL/min (ref >60)
GFR calc non Af Amer: >60 mL/min (ref >60)
Glucose, Bld: 144 mg/dL — ABNORMAL HIGH (ref 70–99)
Potassium: 3.7 mmol/L (ref 3.5–5.1)
Sodium: 136 mmol/L (ref 135–145)
Total Bilirubin: 0.6 mg/dL (ref 0.3–1.2)
Total Protein: 7.1 g/dL (ref 6.5–8.1)

## 2019-06-30 LAB — CBC
HCT: 45.9 % (ref 39.0–52.0)
Hemoglobin: 14.6 g/dL (ref 13.0–17.0)
MCH: 27.2 pg (ref 26.0–34.0)
MCHC: 31.8 g/dL (ref 30.0–36.0)
MCV: 85.6 fL (ref 80.0–100.0)
Platelets: 155 10*3/uL (ref 150–400)
RBC: 5.36 MIL/uL (ref 4.22–5.81)
RDW: 14.1 % (ref 11.5–15.5)
WBC: 4.8 10*3/uL (ref 4.0–10.5)
nRBC: 0 % (ref 0.0–0.2)

## 2019-06-30 LAB — LIPASE, BLOOD: Lipase: 115 U/L — ABNORMAL HIGH (ref 11–51)

## 2019-06-30 MED ORDER — SODIUM CHLORIDE 0.9% FLUSH
3.0000 mL | Freq: Once | INTRAVENOUS | Status: AC
  Administered 2019-07-01: 05:00:00 3 mL via INTRAVENOUS

## 2019-06-30 NOTE — ED Triage Notes (Signed)
Patient tested positive for Covid19 last week reports SOB with productive cough and multiple emesis .

## 2019-07-01 LAB — URINALYSIS, ROUTINE W REFLEX MICROSCOPIC
Bacteria, UA: NONE SEEN
Bilirubin Urine: NEGATIVE
Glucose, UA: NEGATIVE mg/dL
Ketones, ur: 20 mg/dL — AB
Leukocytes,Ua: NEGATIVE
Nitrite: NEGATIVE
Protein, ur: 100 mg/dL — AB
Specific Gravity, Urine: 1.019 (ref 1.005–1.030)
pH: 5 (ref 5.0–8.0)

## 2019-07-01 MED ORDER — SODIUM CHLORIDE 0.9 % IV BOLUS
1000.0000 mL | Freq: Once | INTRAVENOUS | Status: AC
  Administered 2019-07-01: 05:00:00 1000 mL via INTRAVENOUS

## 2019-07-01 MED ORDER — METOCLOPRAMIDE HCL 10 MG PO TABS: 10.0000 mg | ORAL_TABLET | Freq: Four times a day (QID) | ORAL | 0 refills | Status: DC

## 2019-07-01 MED ORDER — BENZONATATE 100 MG PO CAPS
100.0000 mg | ORAL_CAPSULE | Freq: Once | ORAL | Status: AC
  Administered 2019-07-01: 05:00:00 100 mg via ORAL
  Filled 2019-07-01: qty 1

## 2019-07-01 MED ORDER — BENZONATATE 100 MG PO CAPS: 100.0000 mg | ORAL_CAPSULE | Freq: Three times a day (TID) | ORAL | 0 refills | Status: DC

## 2019-07-01 MED ORDER — METOCLOPRAMIDE HCL 5 MG/ML IJ SOLN
10.0000 mg | Freq: Once | INTRAMUSCULAR | Status: AC
  Administered 2019-07-01: 10 mg via INTRAVENOUS
  Filled 2019-07-01: qty 2

## 2019-07-01 MED ORDER — ALBUTEROL SULFATE HFA 108 (90 BASE) MCG/ACT IN AERS
4.0000 | INHALATION_SPRAY | Freq: Once | RESPIRATORY_TRACT | Status: AC
  Administered 2019-07-01: 05:00:00 4 via RESPIRATORY_TRACT
  Filled 2019-07-01: qty 6.7

## 2019-07-01 MED ORDER — KETOROLAC TROMETHAMINE 30 MG/ML IJ SOLN
15.0000 mg | Freq: Once | INTRAMUSCULAR | Status: AC
  Administered 2019-07-01: 05:00:00 15 mg via INTRAVENOUS
  Filled 2019-07-01: qty 1

## 2019-07-01 NOTE — ED Notes (Signed)
Pt given ginger ale and cracker. IV fluids still infusing about 300CC left to infuse.

## 2019-07-01 NOTE — ED Notes (Signed)
ED Provider at bedside. 

## 2019-07-01 NOTE — ED Provider Notes (Signed)
Emergency Department Provider Note   I have reviewed the triage vital signs and the nursing notes.   HISTORY  Chief Complaint COVID+ / SOB/ EMESIS   HPI Scott Glover is a 54 y.o. male who presents with covid symptoms and wife exposure. Has been vomiting. Fevers. Weakness. Still making urine. No other symptoms aside from abdominal pain with coughing.    No other associated or modifying symptoms.    Past Medical History:  Diagnosis Date  . Gout   . Hypertension     There are no problems to display for this patient.   Past Surgical History:  Procedure Laterality Date  . GALLBLADDER SURGERY      Current Outpatient Rx  . Order #: [id number] Class: Print  . Order #: [id number] Class: Print  . Order #: [id number] Class: Normal  . Order #: [id number] Class: Print    Allergies Patient has no known allergies.  No family history on file.  Social History Social History   Tobacco Use  . Smoking status: Never Smoker  . Smokeless tobacco: Never Used  Substance Use Topics  . Alcohol use: No  . Drug use: Never    Review of Systems  All other systems negative except as documented in the HPI. All pertinent positives and negatives as reviewed in the HPI. ____________________________________________   PHYSICAL EXAM:  VITAL SIGNS: ED Triage Vitals  Enc Vitals Group     BP 06/30/19 2052 130/87     Pulse Rate 06/30/19 2052 93     Resp 06/30/19 2052 16     Temp 06/30/19 2052 100.1 F (37.8 C)     Temp Source 06/30/19 2052 Oral     SpO2 06/30/19 2052 91 %    Constitutional: Alert and oriented. Well appearing and in no acute distress. Eyes: Conjunctivae are normal. PERRL. EOMI. Head: Atraumatic. Nose: No congestion/rhinnorhea. Mouth/Throat: Mucous membranes are moist.  Oropharynx non-erythematous. Neck: No stridor.  No meningeal signs.   Cardiovascular: Normal rate, regular rhythm. Good peripheral circulation. Grossly normal heart sounds.   Respiratory: Normal  respiratory effort.  No retractions. Lungs CTAB. Gastrointestinal: Soft and nontender. No distention.  Musculoskeletal: No lower extremity tenderness nor edema. No gross deformities of extremities. Neurologic:  Normal speech and language. No gross focal neurologic deficits are appreciated.  Skin:  Skin is warm, dry and intact. No rash noted.  ____________________________________________   LABS (all labs ordered are listed, but only abnormal results are displayed)  Labs Reviewed  LIPASE, BLOOD - Abnormal; Notable for the following components:      Result Value   Lipase 115 (*)    All other components within normal limits  COMPREHENSIVE METABOLIC PANEL - Abnormal; Notable for the following components:   Glucose, Bld 144 (*)    Calcium 8.5 (*)    Albumin 3.4 (*)    Alkaline Phosphatase 24 (*)    All other components within normal limits  URINALYSIS, ROUTINE W REFLEX MICROSCOPIC - Abnormal; Notable for the following components:   APPearance HAZY (*)    Hgb urine dipstick SMALL (*)    Ketones, ur 20 (*)    Protein, ur 100 (*)    All other components within normal limits  CBC   ____________________________________________  EKG   EKG Interpretation  Date/Time:    Ventricular Rate:    PR Interval:    QRS Duration:   QT Interval:    QTC Calculation:   R Axis:     Text Interpretation:  ____________________________________________  RDEYCXKGY  DG Chest Portable 1 View  Result Date: 06/30/2019 CLINICAL DATA:  COVID positive.  Shortness of breath, cough EXAM: PORTABLE CHEST 1 VIEW COMPARISON:  10/13/2011 FINDINGS: Patchy bilateral lower lobe airspace opacities compatible with pneumonia. Low lung volumes. Heart is normal size. No effusions or acute bony abnormality. IMPRESSION: Low lung volumes with patchy bilateral lower lobe airspace opacities compatible with multifocal pneumonia. Electronically Signed   By: Rolm Baptise M.D.   On: 06/30/2019 21:54    ____________________________________________  INITIAL IMPRESSION / ASSESSMENT AND PLAN / ED COURSE  Overall appears well. Not hypoxic. Not tachypneic. Will work on symptoms, ambulate. Likely discharge without hard criteria for admission.  Symptoms improved and patient without any significant dyspnea or hypoxia even on ambulation.  Overall appears well.  I do think that he feels bad but I do not have criteria for admission at this time and I do not think it would serve him best anyway.  He may get worse I discussed this with him and he will be able to return if that happens.  His lipase is slightly elevated with think this is more just from not eating and drinking much.  Prescriptions provided.  Patient stable for discharge with close return precautions and close PCP follow-up.  Pertinent labs & imaging results that were available during my care of the patient were reviewed by me and considered in my medical decision making (see chart for details).  A medical screening exam was performed and I feel the patient has had an appropriate workup for their chief complaint at this time and likelihood of emergent condition existing is low. They have been counseled on decision, discharge, follow up and which symptoms necessitate immediate return to the emergency department. They or their family verbally stated understanding and agreement with plan and discharged in stable condition.   ____________________________________________  FINAL CLINICAL IMPRESSION(S) / ED DIAGNOSES  Final diagnoses:  COVID-19     MEDICATIONS GIVEN DURING THIS VISIT:  Medications  sodium chloride flush (NS) 0.9 % injection 3 mL (3 mLs Intravenous Given 07/01/19 0521)  ketorolac (TORADOL) 30 MG/ML injection 15 mg (15 mg Intravenous Given 07/01/19 0520)  benzonatate (TESSALON) capsule 100 mg (100 mg Oral Given 07/01/19 0521)  metoCLOPramide (REGLAN) injection 10 mg (10 mg Intravenous Given 07/01/19 0521)  sodium chloride 0.9 %  bolus 1,000 mL (1,000 mLs Intravenous New Bag/Given 07/01/19 0520)  albuterol (VENTOLIN HFA) 108 (90 Base) MCG/ACT inhaler 4 puff (4 puffs Inhalation Given 07/01/19 0521)     NEW OUTPATIENT MEDICATIONS STARTED DURING THIS VISIT:  New Prescriptions   No medications on file    Note:  This note was prepared with assistance of Dragon voice recognition software. Occasional wrong-word or sound-a-like substitutions may have occurred due to the inherent limitations of voice recognition software.   Miaya Lafontant, [name], MD 07/02/19 [phone]

## 2019-07-01 NOTE — ED Notes (Signed)
Pt ambulated on pulse Ox, O2 dropped from 95% to 90% and became short of breath. Once back in bed pt O2 came back to 95%

## 2021-03-10 ENCOUNTER — Encounter (HOSPITAL_COMMUNITY): Payer: Self-pay | Admitting: Emergency Medicine

## 2021-03-10 ENCOUNTER — Other Ambulatory Visit: Payer: Self-pay

## 2021-03-10 ENCOUNTER — Inpatient Hospital Stay (HOSPITAL_COMMUNITY)
Admission: EM | Admit: 2021-03-10 | Discharge: 2021-03-13 | DRG: 440 | Disposition: A | Discharge status: Home / Self Care | Payer: No Typology Code available for payment source | Attending: Internal Medicine | Admitting: Internal Medicine

## 2021-03-10 ENCOUNTER — Emergency Department (HOSPITAL_COMMUNITY): Payer: No Typology Code available for payment source

## 2021-03-10 DIAGNOSIS — K219 Gastro-esophageal reflux disease without esophagitis: Secondary | ICD-10-CM | POA: Diagnosis present

## 2021-03-10 DIAGNOSIS — M109 Gout, unspecified: Secondary | ICD-10-CM | POA: Diagnosis present

## 2021-03-10 DIAGNOSIS — K859 Acute pancreatitis without necrosis or infection, unspecified: Secondary | ICD-10-CM

## 2021-03-10 DIAGNOSIS — Z9049 Acquired absence of other specified parts of digestive tract: Secondary | ICD-10-CM

## 2021-03-10 DIAGNOSIS — K760 Fatty (change of) liver, not elsewhere classified: Secondary | ICD-10-CM | POA: Diagnosis present

## 2021-03-10 DIAGNOSIS — K8500 Idiopathic acute pancreatitis without necrosis or infection: Principal | ICD-10-CM | POA: Diagnosis present

## 2021-03-10 DIAGNOSIS — Z79899 Other long term (current) drug therapy: Secondary | ICD-10-CM

## 2021-03-10 DIAGNOSIS — I10 Essential (primary) hypertension: Secondary | ICD-10-CM | POA: Diagnosis present

## 2021-03-10 DIAGNOSIS — R739 Hyperglycemia, unspecified: Secondary | ICD-10-CM | POA: Diagnosis present

## 2021-03-10 DIAGNOSIS — R7303 Prediabetes: Secondary | ICD-10-CM | POA: Diagnosis present

## 2021-03-10 DIAGNOSIS — Z20822 Contact with and (suspected) exposure to covid-19: Secondary | ICD-10-CM | POA: Diagnosis present

## 2021-03-10 LAB — COMPREHENSIVE METABOLIC PANEL
ALT: 44 U/L (ref 0–44)
AST: 87 U/L — ABNORMAL HIGH (ref 15–41)
Albumin: 3.9 g/dL (ref 3.5–5.0)
Alkaline Phosphatase: 42 U/L (ref 38–126)
Anion gap: 12 (ref 5–15)
BUN: 11 mg/dL (ref 6–20)
CO2: 20 mmol/L — ABNORMAL LOW (ref 22–32)
Calcium: 9.3 mg/dL (ref 8.9–10.3)
Chloride: 107 mmol/L (ref 98–111)
Creatinine, Ser: 1.11 mg/dL (ref 0.61–1.24)
GFR, Estimated: >60 mL/min (ref >60)
Glucose, Bld: 154 mg/dL — ABNORMAL HIGH (ref 70–99)
Potassium: 3.6 mmol/L (ref 3.5–5.1)
Sodium: 139 mmol/L (ref 135–145)
Total Bilirubin: 1.1 mg/dL (ref 0.3–1.2)
Total Protein: 7.1 g/dL (ref 6.5–8.1)

## 2021-03-10 LAB — CBC WITH DIFFERENTIAL/PLATELET
Abs Immature Granulocytes: 0.03 10*3/uL (ref 0.00–0.07)
Basophils Absolute: 0 10*3/uL (ref 0.0–0.1)
Basophils Relative: 0 %
Eosinophils Absolute: 0.4 10*3/uL (ref 0.0–0.5)
Eosinophils Relative: 3 %
HCT: 43.8 % (ref 39.0–52.0)
Hemoglobin: 15 g/dL (ref 13.0–17.0)
Immature Granulocytes: 0 %
Lymphocytes Relative: 48 %
Lymphs Abs: 4.9 10*3/uL — ABNORMAL HIGH (ref 0.7–4.0)
MCH: 29.3 pg (ref 26.0–34.0)
MCHC: 34.2 g/dL (ref 30.0–36.0)
MCV: 85.5 fL (ref 80.0–100.0)
Monocytes Absolute: 0.8 10*3/uL (ref 0.1–1.0)
Monocytes Relative: 7 %
Neutro Abs: 4.4 10*3/uL (ref 1.7–7.7)
Neutrophils Relative %: 42 %
Platelets: 202 10*3/uL (ref 150–400)
RBC: 5.12 MIL/uL (ref 4.22–5.81)
RDW: 13.4 % (ref 11.5–15.5)
WBC: 10.4 10*3/uL (ref 4.0–10.5)
nRBC: 0 % (ref 0.0–0.2)

## 2021-03-10 LAB — RESP PANEL BY RT-PCR (FLU A&B, COVID) ARPGX2
Influenza A by PCR: NEGATIVE
Influenza B by PCR: NEGATIVE
SARS Coronavirus 2 by RT PCR: NEGATIVE

## 2021-03-10 LAB — ETHANOL: Alcohol, Ethyl (B): <10 mg/dL (ref <10)

## 2021-03-10 LAB — LIPASE, BLOOD: Lipase: 2723 U/L — ABNORMAL HIGH (ref 11–51)

## 2021-03-10 MED ORDER — HYDROMORPHONE HCL 1 MG/ML IJ SOLN: 1.0000 mg | Freq: Once | INTRAMUSCULAR | Status: DC

## 2021-03-10 MED ORDER — SODIUM CHLORIDE 0.9% FLUSH
3.0000 mL | Freq: Two times a day (BID) | INTRAVENOUS | Status: DC
  Administered 2021-03-11 – 2021-03-12 (×2): 3 mL via INTRAVENOUS

## 2021-03-10 MED ORDER — ONDANSETRON 4 MG PO TBDP: 4.0000 mg | ORAL_TABLET | Freq: Once | ORAL | Status: DC

## 2021-03-10 MED ORDER — SODIUM CHLORIDE 0.9 % IV SOLN: 250.0000 mL | INTRAVENOUS | Status: DC | PRN

## 2021-03-10 MED ORDER — ACETAMINOPHEN 325 MG PO TABS: 650.0000 mg | ORAL_TABLET | Freq: Four times a day (QID) | ORAL | Status: DC | PRN

## 2021-03-10 MED ORDER — DOCUSATE SODIUM 100 MG PO CAPS
100.0000 mg | ORAL_CAPSULE | Freq: Two times a day (BID) | ORAL | Status: DC
  Administered 2021-03-11 – 2021-03-12 (×3): 100 mg via ORAL
  Filled 2021-03-10 (×4): qty 1

## 2021-03-10 MED ORDER — MORPHINE SULFATE (PF) 4 MG/ML IV SOLN
4.0000 mg | INTRAVENOUS | Status: DC | PRN
  Administered 2021-03-11: 4 mg via INTRAVENOUS
  Filled 2021-03-10: qty 1

## 2021-03-10 MED ORDER — ENOXAPARIN SODIUM 40 MG/0.4ML IJ SOSY
40.0000 mg | PREFILLED_SYRINGE | Freq: Every day | INTRAMUSCULAR | Status: DC
  Administered 2021-03-11 – 2021-03-12 (×3): 40 mg via SUBCUTANEOUS
  Filled 2021-03-10 (×3): qty 0.4

## 2021-03-10 MED ORDER — ONDANSETRON HCL 4 MG/2ML IJ SOLN
4.0000 mg | Freq: Four times a day (QID) | INTRAMUSCULAR | Status: DC | PRN
  Administered 2021-03-11: 4 mg via INTRAVENOUS
  Filled 2021-03-10: qty 2

## 2021-03-10 MED ORDER — ACETAMINOPHEN 650 MG RE SUPP: 650.0000 mg | Freq: Four times a day (QID) | RECTAL | Status: DC | PRN

## 2021-03-10 MED ORDER — MELATONIN 5 MG PO TABS: 10.0000 mg | ORAL_TABLET | Freq: Every evening | ORAL | Status: DC | PRN

## 2021-03-10 MED ORDER — SODIUM CHLORIDE 0.9% FLUSH: 3.0000 mL | INTRAVENOUS | Status: DC | PRN

## 2021-03-10 MED ORDER — ONDANSETRON HCL 4 MG/2ML IJ SOLN
4.0000 mg | Freq: Once | INTRAMUSCULAR | Status: AC
  Administered 2021-03-10: 4 mg via INTRAVENOUS
  Filled 2021-03-10: qty 2

## 2021-03-10 MED ORDER — LACTATED RINGERS IV BOLUS
1000.0000 mL | Freq: Once | INTRAVENOUS | Status: AC
  Administered 2021-03-10: 1000 mL via INTRAVENOUS

## 2021-03-10 MED ORDER — MORPHINE SULFATE (PF) 4 MG/ML IV SOLN
6.0000 mg | Freq: Once | INTRAVENOUS | Status: AC
  Administered 2021-03-10: 6 mg via INTRAVENOUS
  Filled 2021-03-10: qty 2

## 2021-03-10 MED ORDER — LACTATED RINGERS IV SOLN: INTRAVENOUS | Status: DC

## 2021-03-10 MED ORDER — LACTATED RINGERS IV SOLN: INTRAVENOUS | Status: AC

## 2021-03-10 NOTE — Assessment & Plan Note (Signed)
Patient noted to have hyperglycemia.  He has had elevated blood sugars in the past.  We will check an hemoglobin A1c.  He is moderately obese.  We will need to ensure that he does not have diabetes.

## 2021-03-10 NOTE — ED Provider Notes (Signed)
Emergency Medicine Provider Triage Evaluation Note  Scott Glover , a 55 y.o. male  was evaluated in triage.  Pt complains of n/v.  Review of Systems  Positive: Nausea, vomiting, abd pain Negative: Fever, cough, sob, dysuria  Physical Exam  BP (!) 141/89 (BP Location: Left Arm)   Pulse 83   Temp 98.4 F (36.9 C) (Oral)   Resp (!) 22   SpO2 100%  Gen:   Awake, leaning over the chair, retching Resp:  Normal effort  MSK:   Moves extremities without difficulty  Other:    Medical Decision Making  Medically screening exam initiated at 11:43 AM.  Appropriate orders placed.  [name] was informed that the remainder of the evaluation will be completed by another provider, this initial triage assessment does not replace that evaluation, and the importance of remaining in the ED until their evaluation is complete.  Pt report having abd pain with associated nausea and vomiting since last night.  Sxs felt similar to prior pancreatic flare.  Denies alcohol use.     Fayrene Helper, PA-C 03/10/21 1144    Linwood Dibbles, MD 03/13/21 [phone]

## 2021-03-10 NOTE — Progress Notes (Signed)
Abd u/s showed CBD at 8.6 mm.  AST slightly elevated. Pt states he does not drink etoh.  Alk phos and bili are normal. However given his acute pancreatitis without an etiology(pt take no home meds), will to exclude biliary stone. Will order MRCP for AM.

## 2021-03-10 NOTE — H&P (Signed)
History and Physical    Scott HUARACHA MWU:[id number] DOB: April 10, 1966 DOA: 03/10/2021  PCP: Default, Provider, MD   Patient coming from: Home  I have personally briefly reviewed patient's old medical records in [facility]  CC: N/V, abd pain HPI: 55 year old African-American male without any significant prior medical history presents to the ER today with a 1 day history of abdominal pain.  Patient has been staying at the hospital for this past week as his wife is admitted to the hospital.  He states that after leaving the hospital yesterday evening, he went home and had dinner.  He started developing abdominal pain around 9 PM.  This abdominal pain continued.  He got to the hospital today to visit with his wife.  Around 12:00 PM, he started having nausea and vomiting.  Abdominal pain started getting worse.  He denies any diarrhea.  No chest pain or shortness of breath.  Patient states that he had pancreatitis about 10 years ago.  He had his gallbladder taken out.  Patient takes no medications.  He states he has no medical history.  In the ER, patient noted to have abdominal pain.  His labs were significant for lipase of 2723.  Ethanol level was negative.  Serum glucose was elevated at 154.  White count normal was 10.4.  No CT scan was performed.  I did request a abdominal ultrasound to evaluate common bile duct diameter.  Due to the patient's elevated lipase and continue abdominal pain, [facility] hospitalist contacted for admission.   ED Course: lipase elevated. No imaging performed. Iv morphine ordered for pain.  Review of Systems:  Review of Systems  Constitutional: Negative.   HENT: Negative.    Eyes: Negative.   Respiratory: Negative.    Cardiovascular: Negative.   Gastrointestinal:  Positive for abdominal pain, nausea and vomiting. Negative for constipation, diarrhea and melena.  Genitourinary: Negative.   Musculoskeletal:        Chronic ankle edema bilaterally per patient   Skin: Negative.   Neurological: Negative.   Endo/Heme/Allergies: Negative.   Psychiatric/Behavioral: Negative.    All other systems reviewed and are negative.  Past Medical History:  Diagnosis Date   Gout    Hypertension     Past Surgical History:  Procedure Laterality Date   GALLBLADDER SURGERY       reports that he has never smoked. He has never used smokeless tobacco. He reports that he does not drink alcohol and does not use drugs.  No Known Allergies  No family history on file.  Prior to Admission medications   Medication Sig Start Date End Date Taking? Authorizing Provider  benzonatate (TESSALON) 100 MG capsule Take 1 capsule (100 mg total) by mouth every 8 (eight) hours. 07/01/19   Mesner, Barbara Cower, MD  metoCLOPramide (REGLAN) 10 MG tablet Take 1 tablet (10 mg total) by mouth every 6 (six) hours. 07/01/19   Mesner, Barbara Cower, MD  ondansetron (ZOFRAN) 4 MG tablet Take 1 tablet (4 mg total) by mouth every 6 (six) hours. 06/27/19   Caccavale, Sophia, PA-C  colchicine 0.6 MG tablet Take 1 tablet (0.6 mg total) by mouth 2 (two) times daily. Patient not taking: Reported on 07/01/2019 05/04/16 07/01/19  Linwood Dibbles, MD  famotidine (PEPCID) 20 MG tablet Take 1 tablet (20 mg total) by mouth 2 (two) times daily. Patient not taking: Reported on 07/01/2019 10/13/11 07/01/19  Raeford Razor, MD    Physical Exam: Vitals:   03/10/21 1138 03/10/21 1433 03/10/21 1734  BP: Marland Kitchen)  141/89 (!) 149/88 (!) 155/99  Pulse: 83 80 73  Resp: (!) 22 20 20   Temp: 98.4 F (36.9 C)    TempSrc: Oral    SpO2: 100% 100% 100%    Physical Exam Vitals and nursing note reviewed.  Constitutional:      General: He is not in acute distress.    Appearance: He is obese. He is not ill-appearing, toxic-appearing or diaphoretic.  HENT:     Head: Normocephalic and atraumatic.     Nose: Nose normal.  Eyes:     General:        Right eye: No discharge.        Left eye: No discharge.  Cardiovascular:     Rate and  Rhythm: Normal rate and regular rhythm.     Pulses: Normal pulses.  Pulmonary:     Effort: Pulmonary effort is normal. No respiratory distress.     Breath sounds: Normal breath sounds. No wheezing or rales.  Abdominal:     General: Abdomen is protuberant. Bowel sounds are normal.     Tenderness: There is abdominal tenderness in the epigastric area. There is no guarding or rebound.  Musculoskeletal:        General: Normal range of motion.     Right lower leg: Edema present.     Left lower leg: Edema present.     Comments: +1 pitting bilateral ankle edema  Skin:    General: Skin is warm and dry.     Capillary Refill: Capillary refill takes less than 2 seconds.  Neurological:     General: No focal deficit present.     Mental Status: He is alert and oriented to person, place, and time.     Labs on Admission: I have personally reviewed following labs and imaging studies  CBC: Recent Labs  Lab 03/10/21 1149  WBC 10.4  NEUTROABS 4.4  HGB 15.0  HCT 43.8  MCV 85.5  PLT 202   Basic Metabolic Panel: Recent Labs  Lab 03/10/21 1149  NA 139  K 3.6  CL 107  CO2 20*  GLUCOSE 154*  BUN 11  CREATININE 1.11  CALCIUM 9.3   GFR: CrCl cannot be calculated (Unknown ideal weight.). Liver Function Tests: Recent Labs  Lab 03/10/21 1149  AST 87*  ALT 44  ALKPHOS 42  BILITOT 1.1  PROT 7.1  ALBUMIN 3.9   Recent Labs  Lab 03/10/21 1149  LIPASE 2,723*   No results for input(s): AMMONIA in the last 168 hours. Coagulation Profile: No results for input(s): INR, PROTIME in the last 168 hours. Cardiac Enzymes: No results for input(s): CKTOTAL, CKMB, CKMBINDEX, TROPONINI in the last 168 hours. BNP (last 3 results) No results for input(s): PROBNP in the last 8760 hours. HbA1C: No results for input(s): HGBA1C in the last 72 hours. CBG: No results for input(s): GLUCAP in the last 168 hours. Lipid Profile: No results for input(s): CHOL, HDL, LDLCALC, TRIG, CHOLHDL, LDLDIRECT in  the last 72 hours. Thyroid Function Tests: No results for input(s): TSH, T4TOTAL, FREET4, T3FREE, THYROIDAB in the last 72 hours. Anemia Panel: No results for input(s): VITAMINB12, FOLATE, FERRITIN, TIBC, IRON, RETICCTPCT in the last 72 hours. Urine analysis:    Component Value Date/Time   COLORURINE YELLOW 07/01/2019 0209   APPEARANCEUR HAZY (A) 07/01/2019 0209   LABSPEC 1.019 07/01/2019 0209   PHURINE 5.0 07/01/2019 0209   GLUCOSEU NEGATIVE 07/01/2019 0209   HGBUR SMALL (A) 07/01/2019 0209   BILIRUBINUR NEGATIVE 07/01/2019 0209  KETONESUR 20 (A) 07/01/2019 0209   PROTEINUR 100 (A) 07/01/2019 0209   NITRITE NEGATIVE 07/01/2019 0209   LEUKOCYTESUR NEGATIVE 07/01/2019 0209    Radiological Exams on Admission: I have personally reviewed images No results found.  EKG: I have personally reviewed EKG: no EKG performed  Assessment/Plan Principal Problem:   Acute pancreatitis Active Problems:   Hyperglycemia    Acute pancreatitis Admit to medical bed.  Observation status.  Keep NPO.  May have ice chips.  IV morphine for pain as needed.  IV Zofran for nausea vomiting.  Repeat lipase in the morning.  Abdominal ultrasound has been ordered by the ER provider.  If his lipase continues to increase, he may need advanced imaging to include CT scan of the abdomen.  Start gentle IV fluids.  Hyperglycemia Patient noted to have hyperglycemia.  He has had elevated blood sugars in the past.  We will check an hemoglobin A1c.  He is moderately obese.  We will need to ensure that he does not have diabetes.  DVT prophylaxis: Lovenox Code Status: Full Code Family Communication: discussed with pt and his dtr at bedside  Disposition Plan: return home  Consults called: none  Admission status: Observation, Med-Surg   Carollee Herter, DO [facility] 03/10/2021, 7:41 PM

## 2021-03-10 NOTE — ED Notes (Signed)
Pt care taken, pt resting, no complaints at this time. 

## 2021-03-10 NOTE — Assessment & Plan Note (Signed)
Admit to medical bed.  Observation status.  Keep NPO.  May have ice chips.  IV morphine for pain as needed.  IV Zofran for nausea vomiting.  Repeat lipase in the morning.  Abdominal ultrasound has been ordered by the ER provider.  If his lipase continues to increase, he may need advanced imaging to include CT scan of the abdomen.  Start gentle IV fluids.

## 2021-03-10 NOTE — ED Provider Notes (Signed)
[facility] EMERGENCY DEPARTMENT Provider Note   CSN: [id number] Arrival date & time: 03/10/21  1120     History Chief Complaint  Patient presents with   Abdominal Pain   Nausea   Emesis    Scott Glover is a 55 y.o. male with PMH cholecystectomy, distant history of pancreatitis 10 years ago who presents the emergency department for evaluation of epigastric abdominal pain.  Patient states that his symptoms began this morning with associated nausea and vomiting.  He states he has not been drinking alcohol and has been at the bedside of his wife upstairs in this hospital over the last week eating cafeteria food.  Denies chest pain, shortness of breath, diarrhea, fever or other systemic symptoms.   Abdominal Pain Associated symptoms: nausea and vomiting   Associated symptoms: no chest pain, no chills, no cough, no dysuria, no fever, no hematuria, no shortness of breath and no sore throat   Emesis Associated symptoms: abdominal pain   Associated symptoms: no arthralgias, no chills, no cough, no fever and no sore throat       Past Medical History:  Diagnosis Date   Gout    Hypertension     There are no problems to display for this patient.   Past Surgical History:  Procedure Laterality Date   GALLBLADDER SURGERY         No family history on file.  Social History   Tobacco Use   Smoking status: Never   Smokeless tobacco: Never  Substance Use Topics   Alcohol use: No   Drug use: Never    Home Medications Prior to Admission medications   Medication Sig Start Date End Date Taking? Authorizing Provider  benzonatate (TESSALON) 100 MG capsule Take 1 capsule (100 mg total) by mouth every 8 (eight) hours. 07/01/19   Mesner, Barbara Cower, MD  metoCLOPramide (REGLAN) 10 MG tablet Take 1 tablet (10 mg total) by mouth every 6 (six) hours. 07/01/19   Mesner, Barbara Cower, MD  ondansetron (ZOFRAN) 4 MG tablet Take 1 tablet (4 mg total) by mouth every 6 (six) hours.  06/27/19   Caccavale, Sophia, PA-C  colchicine 0.6 MG tablet Take 1 tablet (0.6 mg total) by mouth 2 (two) times daily. Patient not taking: Reported on 07/01/2019 05/04/16 07/01/19  Linwood Dibbles, MD  famotidine (PEPCID) 20 MG tablet Take 1 tablet (20 mg total) by mouth 2 (two) times daily. Patient not taking: Reported on 07/01/2019 10/13/11 07/01/19  Raeford Razor, MD    Allergies    Patient has no known allergies.  Review of Systems   Review of Systems  Constitutional:  Negative for chills and fever.  HENT:  Negative for ear pain and sore throat.   Eyes:  Negative for pain and visual disturbance.  Respiratory:  Negative for cough and shortness of breath.   Cardiovascular:  Negative for chest pain and palpitations.  Gastrointestinal:  Positive for abdominal pain, nausea and vomiting.  Genitourinary:  Negative for dysuria and hematuria.  Musculoskeletal:  Negative for arthralgias and back pain.  Skin:  Negative for color change and rash.  Neurological:  Negative for seizures and syncope.  All other systems reviewed and are negative.  Physical Exam Updated Vital Signs BP (!) 155/99 (BP Location: Left Arm)   Pulse 73   Temp 98.4 F (36.9 C) (Oral)   Resp 20   SpO2 100%   Physical Exam Vitals and nursing note reviewed.  Constitutional:      Appearance: He is well-developed.  HENT:     Head: Normocephalic and atraumatic.  Eyes:     Conjunctiva/sclera: Conjunctivae normal.  Cardiovascular:     Rate and Rhythm: Normal rate and regular rhythm.     Heart sounds: No murmur heard. Pulmonary:     Effort: Pulmonary effort is normal. No respiratory distress.     Breath sounds: Normal breath sounds.  Abdominal:     Palpations: Abdomen is soft.     Tenderness: There is abdominal tenderness in the epigastric area.  Musculoskeletal:     Cervical back: Neck supple.  Skin:    General: Skin is warm and dry.  Neurological:     Mental Status: He is alert.    ED Results / Procedures /  Treatments   Labs (all labs ordered are listed, but only abnormal results are displayed) Labs Reviewed  CBC WITH DIFFERENTIAL/PLATELET - Abnormal; Notable for the following components:      Result Value   Lymphs Abs 4.9 (*)    All other components within normal limits  COMPREHENSIVE METABOLIC PANEL - Abnormal; Notable for the following components:   CO2 20 (*)    Glucose, Bld 154 (*)    AST 87 (*)    All other components within normal limits  LIPASE, BLOOD - Abnormal; Notable for the following components:   Lipase 2,723 (*)    All other components within normal limits  ETHANOL  URINALYSIS, ROUTINE W REFLEX MICROSCOPIC    EKG None  Radiology No results found.  Procedures Procedures   Medications Ordered in ED Medications  ondansetron (ZOFRAN-ODT) disintegrating tablet 4 mg (has no administration in time range)    ED Course  I have reviewed the triage vital signs and the nursing notes.  Pertinent labs & imaging results that were available during my care of the patient were reviewed by me and considered in my medical decision making (see chart for details).    MDM Rules/Calculators/A&P                           Patient seen the emergency department for evaluation of epigastric abdominal pain.  Physical exam reveals significant tenderness in the epigastric region but is otherwise unremarkable.  Laboratory evaluation reveals a mild AST elevation to 87, glucose elevated 154, lipase significantly elevated to 2723.  Patient given nausea and pain control with morphine and Zofran as well as fluid resuscitation.  Patient will require admission for pancreatitis.  Right upper quadrant ultrasound was obtained to measure common bile duct size as the patient is already had a cholecystectomy. Final Clinical Impression(s) / ED Diagnoses Final diagnoses:  None    Rx / DC Orders ED Discharge Orders     None        Shakir Petrosino, Wyn Forster, MD 03/10/21 2004

## 2021-03-10 NOTE — ED Triage Notes (Signed)
Pt from home, complaint of abdominal pain, N/V today.

## 2021-03-10 NOTE — Subjective & Objective (Signed)
CC: N/V, abd pain HPI: 55 year old African-American male without any significant prior medical history presents to the ER today with a 1 day history of abdominal pain.  Patient has been staying at the hospital for this past week as his wife is admitted to the hospital.  He states that after leaving the hospital yesterday evening, he went home and had dinner.  He started developing abdominal pain around 9 PM.  This abdominal pain continued.  He got to the hospital today to visit with his wife.  Around 12:00 PM, he started having nausea and vomiting.  Abdominal pain started getting worse.  He denies any diarrhea.  No chest pain or shortness of breath.  Patient states that he had pancreatitis about 10 years ago.  He had his gallbladder taken out.  Patient takes no medications.  He states he has no medical history.  In the ER, patient noted to have abdominal pain.  His labs were significant for lipase of 2723.  Ethanol level was negative.  Serum glucose was elevated at 154.  White count normal was 10.4.  No CT scan was performed.  I did request a abdominal ultrasound to evaluate common bile duct diameter.  Due to the patient's elevated lipase and continue abdominal pain, [facility] hospitalist contacted for admission.

## 2021-03-11 ENCOUNTER — Observation Stay (HOSPITAL_COMMUNITY): Payer: No Typology Code available for payment source

## 2021-03-11 LAB — COMPREHENSIVE METABOLIC PANEL
ALT: 345 U/L — ABNORMAL HIGH (ref 0–44)
AST: 439 U/L — ABNORMAL HIGH (ref 15–41)
Albumin: 3.3 g/dL — ABNORMAL LOW (ref 3.5–5.0)
Alkaline Phosphatase: 49 U/L (ref 38–126)
Anion gap: 7 (ref 5–15)
BUN: 9 mg/dL (ref 6–20)
CO2: 27 mmol/L (ref 22–32)
Calcium: 8.9 mg/dL (ref 8.9–10.3)
Chloride: 104 mmol/L (ref 98–111)
Creatinine, Ser: 1.06 mg/dL (ref 0.61–1.24)
GFR, Estimated: >60 mL/min (ref >60)
Glucose, Bld: 134 mg/dL — ABNORMAL HIGH (ref 70–99)
Potassium: 4 mmol/L (ref 3.5–5.1)
Sodium: 138 mmol/L (ref 135–145)
Total Bilirubin: 1.8 mg/dL — ABNORMAL HIGH (ref 0.3–1.2)
Total Protein: 6.2 g/dL — ABNORMAL LOW (ref 6.5–8.1)

## 2021-03-11 LAB — LIPASE, BLOOD: Lipase: 547 U/L — ABNORMAL HIGH (ref 11–51)

## 2021-03-11 LAB — URINALYSIS, ROUTINE W REFLEX MICROSCOPIC
Bilirubin Urine: NEGATIVE
Glucose, UA: NEGATIVE mg/dL
Hgb urine dipstick: NEGATIVE
Ketones, ur: 5 mg/dL — AB
Leukocytes,Ua: NEGATIVE
Nitrite: NEGATIVE
Protein, ur: NEGATIVE mg/dL
Specific Gravity, Urine: 1.019 (ref 1.005–1.030)
pH: 7 (ref 5.0–8.0)

## 2021-03-11 LAB — CBC WITH DIFFERENTIAL/PLATELET
Abs Immature Granulocytes: 0.02 10*3/uL (ref 0.00–0.07)
Basophils Absolute: 0 10*3/uL (ref 0.0–0.1)
Basophils Relative: 0 %
Eosinophils Absolute: 0 10*3/uL (ref 0.0–0.5)
Eosinophils Relative: 0 %
HCT: 42.7 % (ref 39.0–52.0)
Hemoglobin: 14.2 g/dL (ref 13.0–17.0)
Immature Granulocytes: 0 %
Lymphocytes Relative: 22 %
Lymphs Abs: 1.7 10*3/uL (ref 0.7–4.0)
MCH: 28.9 pg (ref 26.0–34.0)
MCHC: 33.3 g/dL (ref 30.0–36.0)
MCV: 86.8 fL (ref 80.0–100.0)
Monocytes Absolute: 0.5 10*3/uL (ref 0.1–1.0)
Monocytes Relative: 6 %
Neutro Abs: 5.5 10*3/uL (ref 1.7–7.7)
Neutrophils Relative %: 72 %
Platelets: 181 10*3/uL (ref 150–400)
RBC: 4.92 MIL/uL (ref 4.22–5.81)
RDW: 13.6 % (ref 11.5–15.5)
WBC: 7.7 10*3/uL (ref 4.0–10.5)
nRBC: 0 % (ref 0.0–0.2)

## 2021-03-11 LAB — HEMOGLOBIN A1C
Hgb A1c MFr Bld: 6.3 % — ABNORMAL HIGH (ref 4.8–5.6)
Mean Plasma Glucose: 134.11 mg/dL

## 2021-03-11 LAB — HIV ANTIBODY (ROUTINE TESTING W REFLEX): HIV Screen 4th Generation wRfx: NONREACTIVE

## 2021-03-11 LAB — LIPID PANEL
Cholesterol: 136 mg/dL (ref 0–200)
HDL: 57 mg/dL (ref >40)
LDL Cholesterol: 74 mg/dL (ref 0–99)
Total CHOL/HDL Ratio: 2.4 RATIO
Triglycerides: 23 mg/dL (ref <150)
VLDL: 5 mg/dL (ref 0–40)

## 2021-03-11 MED ORDER — MORPHINE SULFATE (PF) 4 MG/ML IV SOLN
4.0000 mg | INTRAVENOUS | Status: DC | PRN
Start: 2021-03-11 — End: 2021-03-13

## 2021-03-11 MED ORDER — GADOBUTROL 1 MMOL/ML IV SOLN
10.0000 mL | Freq: Once | INTRAVENOUS | Status: AC | PRN
  Administered 2021-03-11: 10 mL via INTRAVENOUS

## 2021-03-11 MED ORDER — PANTOPRAZOLE SODIUM 40 MG PO TBEC
40.0000 mg | DELAYED_RELEASE_TABLET | Freq: Every day | ORAL | Status: DC
  Administered 2021-03-11 – 2021-03-12 (×2): 40 mg via ORAL
  Filled 2021-03-11 (×2): qty 1

## 2021-03-11 NOTE — ED Notes (Signed)
Pt transported to MRI at this time 

## 2021-03-11 NOTE — Progress Notes (Signed)
PROGRESS NOTE    Scott Glover  BTD:[id number] DOB: September 20, 1965 DOA: 03/10/2021 PCP: Default, Provider, MD    Chief Complaint  Patient presents with   Abdominal Pain   Nausea   Emesis    Brief admission Narrative:  As per H&P written by Dr. Imogene Burn on 03/10/21 55 year old African-American male without any significant prior medical history presents to the ER today with a 1 day history of abdominal pain.  Patient has been staying at the hospital for this past week as his wife is admitted to the hospital.  He states that after leaving the hospital yesterday evening, he went home and had dinner.  He started developing abdominal pain around 9 PM.  This abdominal pain continued.  He got to the hospital today to visit with his wife.  Around 12:00 PM, he started having nausea and vomiting.  Abdominal pain started getting worse.  He denies any diarrhea.  No chest pain or shortness of breath.  Patient states that he had pancreatitis about 10 years ago.  He had his gallbladder taken out.  Patient takes no medications.  He states he has no medical history.  In the ER, patient noted to have abdominal pain.  His labs were significant for lipase of 2723.  Ethanol level was negative.  Serum glucose was elevated at 154.  White count normal was 10.4.  No CT scan was performed.  I did request a abdominal ultrasound to evaluate common bile duct diameter.  Due to the patient's elevated lipase and continue abdominal pain, [facility] contacted for admission.   Assessment & Plan: 1-Acute pancreatitis -with concerns for passing gallstone as part of etiology -TG not significantly elevated and no hx of alcohol usage. -MRCP demonstrating no obstruction gallstones on biliary duct. -symptoms has started to improved. -will continue to follow lipase level -continue IVF's, analgesics and antiemetics -hopefully able to advanced diet tomorrow (03/12/21)  -educated about low fat diet when able to tolerate oral  diet. -starting to space out narcotics.  2-Hyperglycemia -in the setting of pancreatitis -A1C 6.3, meeting criteria for pre-diabetes -low carb diet, increase physical activity and weight loss encouraged.  3-GERD -continue PPI  4-transaminitis  -in the setting of problem #1 -follow trend  -no obstructions seen on MRCP -mild hepatic steatosis on [place].   DVT prophylaxis: Lovenox  Code Status: Full Code. Family Communication: no family at bedside  Disposition:   Status is: Inpatient  Remains inpatient appropriate because:IV treatments appropriate due to intensity of illness or inability to take PO  Dispo: The patient is from: Home              Anticipated d/c is to: Home              Patient currently is not medically stable to d/c.   Difficult to place patient No       Consultants:  None   Procedures:  See below for x-ray reports.  Antimicrobials:  none   Subjective: Afebrile, no chest pain, no nausea, no vomiting, still complaining of abd pain and requiring IV analgesics.  Objective: Vitals:   03/11/21 1130 03/11/21 1200 03/11/21 1230 03/11/21 1406  BP: 120/80 123/86 120/87 (!) 152/89  Pulse: 62 (!) 54 64 79  Resp: 14 11 15 16   Temp: 98.5 F (36.9 C)   99.2 F (37.3 C)  TempSrc: Oral   Oral  SpO2: 99% 97% 99% 98%    Intake/Output Summary (Last 24 hours) at 03/11/2021 1708 Last data filed at  03/11/2021 1707 Gross per 24 hour  Intake 1523.31 ml  Output --  Net 1523.31 ml   There were no vitals filed for this visit.  Examination:  General exam: In no major distress; reports an improvement in his abdominal pain (even though still requiring IV analgesics) and is currently not having any nausea or vomiting.  Patient is afebrile. Respiratory system: Clear to auscultation. Respiratory effort normal.  No using accessory muscle. Cardiovascular system: S1 & S2 heard, RRR. No JVD, murmurs, rubs, gallops or clicks. No pedal edema. Gastrointestinal system:  Abdomen is nondistended, soft and tender to palpation in the epigastric region. No organomegaly or masses felt. Normal bowel sounds heard. Central nervous system: Alert and oriented. No focal neurological deficits. Extremities: No cyanosis or clubbing. Skin: No petechiae. Psychiatry: Judgement and insight appear normal. Mood & affect appropriate.     Data Reviewed: I have personally reviewed following labs and imaging studies  CBC: Recent Labs  Lab 03/10/21 1149 03/11/21 0326  WBC 10.4 7.7  NEUTROABS 4.4 5.5  HGB 15.0 14.2  HCT 43.8 42.7  MCV 85.5 86.8  PLT 202 181    Basic Metabolic Panel: Recent Labs  Lab 03/10/21 1149 03/11/21 0326  NA 139 138  K 3.6 4.0  CL 107 104  CO2 20* 27  GLUCOSE 154* 134*  BUN 11 9  CREATININE 1.11 1.06  CALCIUM 9.3 8.9    GFR: CrCl cannot be calculated (Unknown ideal weight.).  Liver Function Tests: Recent Labs  Lab 03/10/21 1149 03/11/21 0326  AST 87* 439*  ALT 44 345*  ALKPHOS 42 49  BILITOT 1.1 1.8*  PROT 7.1 6.2*  ALBUMIN 3.9 3.3*    CBG: No results for input(s): GLUCAP in the last 168 hours.   Recent Results (from the past 240 hour(s))  Resp Panel by RT-PCR (Flu A&B, Covid) Nasopharyngeal Swab     Status: None   Collection Time: 03/10/21  7:38 PM   Specimen: Nasopharyngeal Swab; Nasopharyngeal(NP) swabs in vial transport medium  Result Value Ref Range Status   SARS Coronavirus 2 by RT PCR NEGATIVE NEGATIVE Final    Comment: (NOTE) SARS-CoV-2 target nucleic acids are NOT DETECTED.  The SARS-CoV-2 RNA is generally detectable in upper respiratory specimens during the acute phase of infection. The lowest concentration of SARS-CoV-2 viral copies this assay can detect is 138 copies/mL. A negative result does not preclude SARS-Cov-2 infection and should not be used as the sole basis for treatment or other patient management decisions. A negative result may occur with  improper specimen collection/handling, submission  of specimen other than nasopharyngeal swab, presence of viral mutation(s) within the areas targeted by this assay, and inadequate number of viral copies(<138 copies/mL). A negative result must be combined with clinical observations, patient history, and epidemiological information. The expected result is Negative.  Fact Sheet for Patients:  BloggerCourse.com  Fact Sheet for Healthcare Providers:  SeriousBroker.it  This test is no t yet approved or cleared by the [place] FDA and  has been authorized for detection and/or diagnosis of SARS-CoV-2 by FDA under an Emergency Use Authorization (EUA). This EUA will remain  in effect (meaning this test can be used) for the duration of the COVID-19 declaration under Section 564(b)(1) of the Act, 21 U.S.C.section 360bbb-3(b)(1), unless the authorization is terminated  or revoked sooner.       Influenza A by PCR NEGATIVE NEGATIVE Final   Influenza B by PCR NEGATIVE NEGATIVE Final    Comment: (NOTE) The Xpert Xpress  SARS-CoV-2/FLU/RSV plus assay is intended as an aid in the diagnosis of influenza from Nasopharyngeal swab specimens and should not be used as a sole basis for treatment. Nasal washings and aspirates are unacceptable for Xpert Xpress SARS-CoV-2/FLU/RSV testing.  Fact Sheet for Patients: BloggerCourse.com  Fact Sheet for Healthcare Providers: SeriousBroker.it  This test is not yet approved or cleared by the [place] FDA and has been authorized for detection and/or diagnosis of SARS-CoV-2 by FDA under an Emergency Use Authorization (EUA). This EUA will remain in effect (meaning this test can be used) for the duration of the COVID-19 declaration under Section 564(b)(1) of the Act, 21 U.S.C. section 360bbb-3(b)(1), unless the authorization is terminated or revoked.  Performed at [facility], [address],  [place], [place] [address]      Radiology Studies: MR 3D Recon At Scanner  Result Date: 03/11/2021 CLINICAL DATA:  55 year old male with history of 1 day of severe abdominal pain, presenting with severe acute pancreatitis. EXAM: MRI ABDOMEN WITHOUT AND WITH CONTRAST (INCLUDING MRCP) TECHNIQUE: Multiplanar multisequence MR imaging of the abdomen was performed both before and after the administration of intravenous contrast. Heavily T2-weighted images of the biliary and pancreatic ducts were obtained, and three-dimensional MRCP images were rendered by post processing. CONTRAST:  54mL GADAVIST GADOBUTROL 1 MMOL/ML IV SOLN COMPARISON:  No prior abdominal MRI. Abdominal ultrasound 03/10/2021. FINDINGS: Lower chest: Unremarkable. Hepatobiliary: Mild diffuse loss of signal intensity throughout the hepatic parenchyma on out of phase dual echo images, indicative of a background of mild hepatic steatosis. No suspicious cystic or solid hepatic lesions. Status post cholecystectomy. No intrahepatic biliary ductal dilatation noted on MRCP images. Common bile duct is mildly dilated measuring 8 mm within the porta hepatis, which is within normal limits in this post cholecystectomy patient. No filling defects within the common bile duct to suggest choledocholithiasis. Pancreas: No pancreatic mass. No pancreatic ductal dilatation noted on MRCP images. Increased T2 signal intensity in the head and uncinate process of the pancreas where there are adjacent surrounding inflammatory changes and peripancreatic fluid, compatible with reported clinical history of acute pancreatitis. Pancreatic parenchyma enhances normally at this time. No well organized peripancreatic fluid collection to indicate presence of a pseudocyst at this time. Spleen:  Unremarkable. Adrenals/Urinary Tract: Small T1 hypointense, T2 hyperintense nonenhancing lesions are noted in both kidneys compatible with simple cysts, largest of which measures only 11 mm in the  interpolar region of the left kidney. No other aggressive appearing renal lesions are noted. No hydroureteronephrosis in the visualized portions of the abdomen. Bilateral adrenal glands are normal in appearance. Stomach/Bowel: Inflammatory changes are noted adjacent to the duodenum, presumably secondary to acute pancreatitis. Otherwise, unremarkable. Vascular/Lymphatic: No significant atherosclerotic disease or aneurysm noted in the visualized abdominal vasculature. No lymphadenopathy Other: Small amount of fluid in the retroperitoneum centered around the pancreas. No significant volume of ascites noted in the visualized portions of the peritoneal cavity. Musculoskeletal: No aggressive appearing osseous lesions are noted in the visualized portions of the skeleton. IMPRESSION: 1. Imaging findings compatible with acute pancreatitis, as above. No complicating features are noted at this time. Specifically, no pancreatic necrosis or pseudocyst. 2. Status post cholecystectomy. Mild dilatation of the common bile duct is compatible with benign post cholecystectomy physiology. No choledocholithiasis. 3. Mild hepatic steatosis. Electronically Signed   By: Trudie Reed M.D.   On: 03/11/2021 09:06   MR ABDOMEN MRCP W WO CONTAST  Result Date: 03/11/2021 CLINICAL DATA:  55 year old male with history of 1  day of severe abdominal pain, presenting with severe acute pancreatitis. EXAM: MRI ABDOMEN WITHOUT AND WITH CONTRAST (INCLUDING MRCP) TECHNIQUE: Multiplanar multisequence MR imaging of the abdomen was performed both before and after the administration of intravenous contrast. Heavily T2-weighted images of the biliary and pancreatic ducts were obtained, and three-dimensional MRCP images were rendered by post processing. CONTRAST:  90mL GADAVIST GADOBUTROL 1 MMOL/ML IV SOLN COMPARISON:  No prior abdominal MRI. Abdominal ultrasound 03/10/2021. FINDINGS: Lower chest: Unremarkable. Hepatobiliary: Mild diffuse loss of signal  intensity throughout the hepatic parenchyma on out of phase dual echo images, indicative of a background of mild hepatic steatosis. No suspicious cystic or solid hepatic lesions. Status post cholecystectomy. No intrahepatic biliary ductal dilatation noted on MRCP images. Common bile duct is mildly dilated measuring 8 mm within the porta hepatis, which is within normal limits in this post cholecystectomy patient. No filling defects within the common bile duct to suggest choledocholithiasis. Pancreas: No pancreatic mass. No pancreatic ductal dilatation noted on MRCP images. Increased T2 signal intensity in the head and uncinate process of the pancreas where there are adjacent surrounding inflammatory changes and peripancreatic fluid, compatible with reported clinical history of acute pancreatitis. Pancreatic parenchyma enhances normally at this time. No well organized peripancreatic fluid collection to indicate presence of a pseudocyst at this time. Spleen:  Unremarkable. Adrenals/Urinary Tract: Small T1 hypointense, T2 hyperintense nonenhancing lesions are noted in both kidneys compatible with simple cysts, largest of which measures only 11 mm in the interpolar region of the left kidney. No other aggressive appearing renal lesions are noted. No hydroureteronephrosis in the visualized portions of the abdomen. Bilateral adrenal glands are normal in appearance. Stomach/Bowel: Inflammatory changes are noted adjacent to the duodenum, presumably secondary to acute pancreatitis. Otherwise, unremarkable. Vascular/Lymphatic: No significant atherosclerotic disease or aneurysm noted in the visualized abdominal vasculature. No lymphadenopathy Other: Small amount of fluid in the retroperitoneum centered around the pancreas. No significant volume of ascites noted in the visualized portions of the peritoneal cavity. Musculoskeletal: No aggressive appearing osseous lesions are noted in the visualized portions of the skeleton.  IMPRESSION: 1. Imaging findings compatible with acute pancreatitis, as above. No complicating features are noted at this time. Specifically, no pancreatic necrosis or pseudocyst. 2. Status post cholecystectomy. Mild dilatation of the common bile duct is compatible with benign post cholecystectomy physiology. No choledocholithiasis. 3. Mild hepatic steatosis. Electronically Signed   By: Trudie Reed M.D.   On: 03/11/2021 09:06   US Abdomen Limited RUQ (LIVER/GB)  Result Date: 03/10/2021 CLINICAL DATA:  Pancreatitis. EXAM: ULTRASOUND ABDOMEN LIMITED RIGHT UPPER QUADRANT COMPARISON:  None. FINDINGS: Gallbladder: Gallbladder is surgically absent. Common bile duct: Diameter: 8.6 mm. Liver: No focal lesion identified. Within normal limits in parenchymal echogenicity. Portal vein is patent on color Doppler imaging with normal direction of blood flow towards the liver. Other: None. IMPRESSION: 1. Cholecystectomy. The common bile duct is dilated. This may be within normal limits, but correlation with lab values recommended to exclude distal biliary obstruction. Consider further evaluation with MRCP or ERCP as clinically warranted. Electronically Signed   By: Darliss Cheney M.D.   On: 03/10/2021 20:35     Scheduled Meds:  docusate sodium  100 mg Oral BID   enoxaparin (LOVENOX) injection  40 mg Subcutaneous QHS   ondansetron  4 mg Oral Once   sodium chloride flush  3 mL Intravenous Q12H   Continuous Infusions:  sodium chloride     lactated ringers 100 mL/hr at 03/11/21 1156  LOS: 0 days    Time spent: 35 minutes    Vassie Loll, MD [facility]   To contact the attending provider between 7A-7P or the covering provider during after hours 7P-7A, please log into the web site [email or web address] and access using universal [place] password for that web site. If you do not have the password, please call the hospital operator.  03/11/2021, 5:08 PM

## 2021-03-11 NOTE — Progress Notes (Signed)
HOSPITAL MEDICINE OVERNIGHT EVENT NOTE    Nursing notifies me of patient's request to visit spouse on 4 E. as they are currently admitted.  Patient is awake alert and oriented x3, is currently ambulatory and is not on telemetry.  Permission granted.  Marinda Elk  MD [facility]

## 2021-03-11 NOTE — ED Notes (Signed)
MRI called. Transport placed by MRI to take pt to MRI.

## 2021-03-11 NOTE — ED Notes (Signed)
Pt is NPO at this time

## 2021-03-12 LAB — COMPREHENSIVE METABOLIC PANEL
ALT: 191 U/L — ABNORMAL HIGH (ref 0–44)
AST: 89 U/L — ABNORMAL HIGH (ref 15–41)
Albumin: 3.1 g/dL — ABNORMAL LOW (ref 3.5–5.0)
Alkaline Phosphatase: 48 U/L (ref 38–126)
Anion gap: 9 (ref 5–15)
BUN: 10 mg/dL (ref 6–20)
CO2: 21 mmol/L — ABNORMAL LOW (ref 22–32)
Calcium: 8.5 mg/dL — ABNORMAL LOW (ref 8.9–10.3)
Chloride: 104 mmol/L (ref 98–111)
Creatinine, Ser: 1.07 mg/dL (ref 0.61–1.24)
GFR, Estimated: >60 mL/min (ref >60)
Glucose, Bld: 84 mg/dL (ref 70–99)
Potassium: 3.7 mmol/L (ref 3.5–5.1)
Sodium: 134 mmol/L — ABNORMAL LOW (ref 135–145)
Total Bilirubin: 1.6 mg/dL — ABNORMAL HIGH (ref 0.3–1.2)
Total Protein: 6.1 g/dL — ABNORMAL LOW (ref 6.5–8.1)

## 2021-03-12 LAB — LIPASE, BLOOD: Lipase: 109 U/L — ABNORMAL HIGH (ref 11–51)

## 2021-03-12 MED ORDER — SODIUM CHLORIDE 0.9 % IV SOLN: INTRAVENOUS | Status: DC

## 2021-03-12 NOTE — Progress Notes (Signed)
Pt ambulated to [id number] to visit his wife

## 2021-03-12 NOTE — Progress Notes (Signed)
PROGRESS NOTE    Scott Glover  KDX:[id number] DOB: 1965/11/15 DOA: 03/10/2021 PCP: Default, Provider, MD    Brief Narrative:  Patient with a significant medical issues, history of pancreatitis 10 years ago status postcholecystectomy presented to the emergency room with severe abdominal pain of 1 day.  Found to have lipase of 2723.  Diagnosed with acute pancreatitis and admitted to the hospital.   Assessment & Plan:   Principal Problem:   Acute pancreatitis Active Problems:   Hyperglycemia  Acute idiopathic pancreatitis: Treated n.p.o., IV fluids and adequate pain medications.  Clinically and biochemically improving.  Electrolytes are adequate. Challenge with clears today.  Continue IV fluids.  Recheck lipase and electrolytes tomorrow morning. Patient had cholecystectomy.  MRCP with no evidence of choledocholithiasis. Triglycerides 23. A1c 6.3. Denies ongoing alcohol use.  He has been in the hospital for last 2 weeks because his wife is sick. Probably idiopathic.  Third time happened in his lifetime.  No offending medications.  Hyperglycemia: Hemoglobin A1c 6.3.  Blood sugar stable.  No indication for treatment.  DVT prophylaxis: enoxaparin (LOVENOX) injection 40 mg Start: 03/11/21 0000 SCDs Start: 03/10/21 2348   Code Status: Full code Family Communication: None Disposition Plan: Status is: Inpatient  Remains inpatient appropriate because:IV treatments appropriate due to intensity of illness or inability to take PO and Inpatient level of care appropriate due to severity of illness  Dispo: The patient is from: Home              Anticipated d/c is to: Home              Patient currently is not medically stable to d/c.   Difficult to place patient No         Consultants:  None  Procedures:  MRCP  Antimicrobials:  None   Subjective: Patient seen and examined.  No overnight events.  He was able to walk around and go to see his wife admitted to 4 E. last  night.  Pain is minimal and he has not used any injectable pain medication since last night.  Wants to try some liquids.  No bowel movement since last 2 days, however passing gas.  Objective: Vitals:   03/11/21 1941 03/12/21 0003 03/12/21 0424 03/12/21 1223  BP: 134/80 138/90 136/80 125/75  Pulse: 73 73 79 71  Resp: 16 16 16 16   Temp: 99 F (37.2 C) 98.9 F (37.2 C) 98.6 F (37 C) 98 F (36.7 C)  TempSrc: Oral Oral Oral Oral  SpO2: 99% 100% 99% 100%  Weight:   110 kg     Intake/Output Summary (Last 24 hours) at 03/12/2021 1425 Last data filed at 03/11/2021 1707 Gross per 24 hour  Intake 819.83 ml  Output --  Net 819.83 ml   Filed Weights   03/12/21 0424  Weight: 110 kg    Examination:  General exam: Appears calm and comfortable  Respiratory system: Clear to auscultation. Respiratory effort normal. Cardiovascular system: S1 & S2 heard, RRR.  Gastrointestinal system: Soft.  Nontender.  Bowel sounds present. Central nervous system: Alert and oriented. No focal neurological deficits. Extremities: Symmetric 5 x 5 power. Skin: No rashes, lesions or ulcers Psychiatry: Judgement and insight appear normal. Mood & affect appropriate.     Data Reviewed: I have personally reviewed following labs and imaging studies  CBC: Recent Labs  Lab 03/10/21 1149 03/11/21 0326  WBC 10.4 7.7  NEUTROABS 4.4 5.5  HGB 15.0 14.2  HCT 43.8 42.7  MCV 85.5  86.8  PLT 202 181   Basic Metabolic Panel: Recent Labs  Lab 03/10/21 1149 03/11/21 0326 03/12/21 0505  NA 139 138 134*  K 3.6 4.0 3.7  CL 107 104 104  CO2 20* 27 21*  GLUCOSE 154* 134* 84  BUN 11 9 10   CREATININE 1.11 1.06 1.07  CALCIUM 9.3 8.9 8.5*   GFR: CrCl cannot be calculated (Unknown ideal weight.). Liver Function Tests: Recent Labs  Lab 03/10/21 1149 03/11/21 0326 03/12/21 0505  AST 87* 439* 89*  ALT 44 345* 191*  ALKPHOS 42 49 48  BILITOT 1.1 1.8* 1.6*  PROT 7.1 6.2* 6.1*  ALBUMIN 3.9 3.3* 3.1*   Recent  Labs  Lab 03/10/21 1149 03/11/21 0326 03/12/21 0505  LIPASE 2,723* 547* 109*   No results for input(s): AMMONIA in the last 168 hours. Coagulation Profile: No results for input(s): INR, PROTIME in the last 168 hours. Cardiac Enzymes: No results for input(s): CKTOTAL, CKMB, CKMBINDEX, TROPONINI in the last 168 hours. BNP (last 3 results) No results for input(s): PROBNP in the last 8760 hours. HbA1C: Recent Labs    03/11/21 0326  HGBA1C 6.3*   CBG: No results for input(s): GLUCAP in the last 168 hours. Lipid Profile: Recent Labs    03/11/21 0326  CHOL 136  HDL 57  LDLCALC 74  TRIG 23  CHOLHDL 2.4   Thyroid Function Tests: No results for input(s): TSH, T4TOTAL, FREET4, T3FREE, THYROIDAB in the last 72 hours. Anemia Panel: No results for input(s): VITAMINB12, FOLATE, FERRITIN, TIBC, IRON, RETICCTPCT in the last 72 hours. Sepsis Labs: No results for input(s): PROCALCITON, LATICACIDVEN in the last 168 hours.  Recent Results (from the past 240 hour(s))  Resp Panel by RT-PCR (Flu A&B, Covid) Nasopharyngeal Swab     Status: None   Collection Time: 03/10/21  7:38 PM   Specimen: Nasopharyngeal Swab; Nasopharyngeal(NP) swabs in vial transport medium  Result Value Ref Range Status   SARS Coronavirus 2 by RT PCR NEGATIVE NEGATIVE Final    Comment: (NOTE) SARS-CoV-2 target nucleic acids are NOT DETECTED.  The SARS-CoV-2 RNA is generally detectable in upper respiratory specimens during the acute phase of infection. The lowest concentration of SARS-CoV-2 viral copies this assay can detect is 138 copies/mL. A negative result does not preclude SARS-Cov-2 infection and should not be used as the sole basis for treatment or other patient management decisions. A negative result may occur with  improper specimen collection/handling, submission of specimen other than nasopharyngeal swab, presence of viral mutation(s) within the areas targeted by this assay, and inadequate number of  viral copies(<138 copies/mL). A negative result must be combined with clinical observations, patient history, and epidemiological information. The expected result is Negative.  Fact Sheet for Patients:  03/12/21  Fact Sheet for Healthcare Providers:  BloggerCourse.com  This test is no t yet approved or cleared by the [employer] FDA and  has been authorized for detection and/or diagnosis of SARS-CoV-2 by FDA under an Emergency Use Authorization (EUA). This EUA will remain  in effect (meaning this test can be used) for the duration of the COVID-19 declaration under Section 564(b)(1) of the Act, 21 U.S.C.section 360bbb-3(b)(1), unless the authorization is terminated  or revoked sooner.       Influenza A by PCR NEGATIVE NEGATIVE Final   Influenza B by PCR NEGATIVE NEGATIVE Final    Comment: (NOTE) The Xpert Xpress SARS-CoV-2/FLU/RSV plus assay is intended as an aid in the diagnosis of influenza from Nasopharyngeal swab specimens and should  not be used as a sole basis for treatment. Nasal washings and aspirates are unacceptable for Xpert Xpress SARS-CoV-2/FLU/RSV testing.  Fact Sheet for Patients: BloggerCourse.com  Fact Sheet for Healthcare Providers: [employer]it  This test is not yet approved or cleared by the [place] FDA and has been authorized for detection and/or diagnosis of SARS-CoV-2 by FDA under an Emergency Use Authorization (EUA). This EUA will remain in effect (meaning this test can be used) for the duration of the COVID-19 declaration under Section 564(b)(1) of the Act, 21 U.S.C. section 360bbb-3(b)(1), unless the authorization is terminated or revoked.  Performed at [facility], [address], [place], [place] [address]          Radiology Studies: MR 3D Recon At Scanner  Result Date: 03/11/2021 CLINICAL DATA:  55 year old male  with history of 1 day of severe abdominal pain, presenting with severe acute pancreatitis. EXAM: MRI ABDOMEN WITHOUT AND WITH CONTRAST (INCLUDING MRCP) TECHNIQUE: Multiplanar multisequence MR imaging of the abdomen was performed both before and after the administration of intravenous contrast. Heavily T2-weighted images of the biliary and pancreatic ducts were obtained, and three-dimensional MRCP images were rendered by post processing. CONTRAST:  62mL GADAVIST GADOBUTROL 1 MMOL/ML IV SOLN COMPARISON:  No prior abdominal MRI. Abdominal ultrasound 03/10/2021. FINDINGS: Lower chest: Unremarkable. Hepatobiliary: Mild diffuse loss of signal intensity throughout the hepatic parenchyma on out of phase dual echo images, indicative of a background of mild hepatic steatosis. No suspicious cystic or solid hepatic lesions. Status post cholecystectomy. No intrahepatic biliary ductal dilatation noted on MRCP images. Common bile duct is mildly dilated measuring 8 mm within the porta hepatis, which is within normal limits in this post cholecystectomy patient. No filling defects within the common bile duct to suggest choledocholithiasis. Pancreas: No pancreatic mass. No pancreatic ductal dilatation noted on MRCP images. Increased T2 signal intensity in the head and uncinate process of the pancreas where there are adjacent surrounding inflammatory changes and peripancreatic fluid, compatible with reported clinical history of acute pancreatitis. Pancreatic parenchyma enhances normally at this time. No well organized peripancreatic fluid collection to indicate presence of a pseudocyst at this time. Spleen:  Unremarkable. Adrenals/Urinary Tract: Small T1 hypointense, T2 hyperintense nonenhancing lesions are noted in both kidneys compatible with simple cysts, largest of which measures only 11 mm in the interpolar region of the left kidney. No other aggressive appearing renal lesions are noted. No hydroureteronephrosis in the visualized  portions of the abdomen. Bilateral adrenal glands are normal in appearance. Stomach/Bowel: Inflammatory changes are noted adjacent to the duodenum, presumably secondary to acute pancreatitis. Otherwise, unremarkable. Vascular/Lymphatic: No significant atherosclerotic disease or aneurysm noted in the visualized abdominal vasculature. No lymphadenopathy Other: Small amount of fluid in the retroperitoneum centered around the pancreas. No significant volume of ascites noted in the visualized portions of the peritoneal cavity. Musculoskeletal: No aggressive appearing osseous lesions are noted in the visualized portions of the skeleton. IMPRESSION: 1. Imaging findings compatible with acute pancreatitis, as above. No complicating features are noted at this time. Specifically, no pancreatic necrosis or pseudocyst. 2. Status post cholecystectomy. Mild dilatation of the common bile duct is compatible with benign post cholecystectomy physiology. No choledocholithiasis. 3. Mild hepatic steatosis. Electronically Signed   By: Trudie Reed M.D.   On: 03/11/2021 09:06   MR ABDOMEN MRCP W WO CONTAST  Result Date: 03/11/2021 CLINICAL DATA:  55 year old male with history of 1 day of severe abdominal pain, presenting with severe acute pancreatitis. EXAM: MRI ABDOMEN WITHOUT AND  WITH CONTRAST (INCLUDING MRCP) TECHNIQUE: Multiplanar multisequence MR imaging of the abdomen was performed both before and after the administration of intravenous contrast. Heavily T2-weighted images of the biliary and pancreatic ducts were obtained, and three-dimensional MRCP images were rendered by post processing. CONTRAST:  26mL GADAVIST GADOBUTROL 1 MMOL/ML IV SOLN COMPARISON:  No prior abdominal MRI. Abdominal ultrasound 03/10/2021. FINDINGS: Lower chest: Unremarkable. Hepatobiliary: Mild diffuse loss of signal intensity throughout the hepatic parenchyma on out of phase dual echo images, indicative of a background of mild hepatic steatosis. No  suspicious cystic or solid hepatic lesions. Status post cholecystectomy. No intrahepatic biliary ductal dilatation noted on MRCP images. Common bile duct is mildly dilated measuring 8 mm within the porta hepatis, which is within normal limits in this post cholecystectomy patient. No filling defects within the common bile duct to suggest choledocholithiasis. Pancreas: No pancreatic mass. No pancreatic ductal dilatation noted on MRCP images. Increased T2 signal intensity in the head and uncinate process of the pancreas where there are adjacent surrounding inflammatory changes and peripancreatic fluid, compatible with reported clinical history of acute pancreatitis. Pancreatic parenchyma enhances normally at this time. No well organized peripancreatic fluid collection to indicate presence of a pseudocyst at this time. Spleen:  Unremarkable. Adrenals/Urinary Tract: Small T1 hypointense, T2 hyperintense nonenhancing lesions are noted in both kidneys compatible with simple cysts, largest of which measures only 11 mm in the interpolar region of the left kidney. No other aggressive appearing renal lesions are noted. No hydroureteronephrosis in the visualized portions of the abdomen. Bilateral adrenal glands are normal in appearance. Stomach/Bowel: Inflammatory changes are noted adjacent to the duodenum, presumably secondary to acute pancreatitis. Otherwise, unremarkable. Vascular/Lymphatic: No significant atherosclerotic disease or aneurysm noted in the visualized abdominal vasculature. No lymphadenopathy Other: Small amount of fluid in the retroperitoneum centered around the pancreas. No significant volume of ascites noted in the visualized portions of the peritoneal cavity. Musculoskeletal: No aggressive appearing osseous lesions are noted in the visualized portions of the skeleton. IMPRESSION: 1. Imaging findings compatible with acute pancreatitis, as above. No complicating features are noted at this time. Specifically,  no pancreatic necrosis or pseudocyst. 2. Status post cholecystectomy. Mild dilatation of the common bile duct is compatible with benign post cholecystectomy physiology. No choledocholithiasis. 3. Mild hepatic steatosis. Electronically Signed   By: Trudie Reed M.D.   On: 03/11/2021 09:06   US Abdomen Limited RUQ (LIVER/GB)  Result Date: 03/10/2021 CLINICAL DATA:  Pancreatitis. EXAM: ULTRASOUND ABDOMEN LIMITED RIGHT UPPER QUADRANT COMPARISON:  None. FINDINGS: Gallbladder: Gallbladder is surgically absent. Common bile duct: Diameter: 8.6 mm. Liver: No focal lesion identified. Within normal limits in parenchymal echogenicity. Portal vein is patent on color Doppler imaging with normal direction of blood flow towards the liver. Other: None. IMPRESSION: 1. Cholecystectomy. The common bile duct is dilated. This may be within normal limits, but correlation with lab values recommended to exclude distal biliary obstruction. Consider further evaluation with MRCP or ERCP as clinically warranted. Electronically Signed   By: Darliss Cheney M.D.   On: 03/10/2021 20:35        Scheduled Meds:  docusate sodium  100 mg Oral BID   enoxaparin (LOVENOX) injection  40 mg Subcutaneous QHS   ondansetron  4 mg Oral Once   pantoprazole  40 mg Oral Daily   sodium chloride flush  3 mL Intravenous Q12H   Continuous Infusions:  sodium chloride     sodium chloride 125 mL/hr at 03/12/21 1135     LOS: 1 day  Time spent: 30 minutes    Barb Merino, MD [facility] Pager [phone]

## 2021-03-13 LAB — COMPREHENSIVE METABOLIC PANEL
ALT: 131 U/L — ABNORMAL HIGH (ref 0–44)
AST: 43 U/L — ABNORMAL HIGH (ref 15–41)
Albumin: 3.3 g/dL — ABNORMAL LOW (ref 3.5–5.0)
Alkaline Phosphatase: 43 U/L (ref 38–126)
Anion gap: 7 (ref 5–15)
BUN: 10 mg/dL (ref 6–20)
CO2: 22 mmol/L (ref 22–32)
Calcium: 8.4 mg/dL — ABNORMAL LOW (ref 8.9–10.3)
Chloride: 107 mmol/L (ref 98–111)
Creatinine, Ser: 1.01 mg/dL (ref 0.61–1.24)
GFR, Estimated: >60 mL/min (ref >60)
Glucose, Bld: 114 mg/dL — ABNORMAL HIGH (ref 70–99)
Potassium: 4.4 mmol/L (ref 3.5–5.1)
Sodium: 136 mmol/L (ref 135–145)
Total Bilirubin: 1.5 mg/dL — ABNORMAL HIGH (ref 0.3–1.2)
Total Protein: 6.3 g/dL — ABNORMAL LOW (ref 6.5–8.1)

## 2021-03-13 LAB — CBC WITH DIFFERENTIAL/PLATELET
Abs Immature Granulocytes: 0.02 10*3/uL (ref 0.00–0.07)
Basophils Absolute: 0 10*3/uL (ref 0.0–0.1)
Basophils Relative: 1 %
Eosinophils Absolute: 0.4 10*3/uL (ref 0.0–0.5)
Eosinophils Relative: 5 %
HCT: 43.5 % (ref 39.0–52.0)
Hemoglobin: 14.5 g/dL (ref 13.0–17.0)
Immature Granulocytes: 0 %
Lymphocytes Relative: 31 %
Lymphs Abs: 2.4 10*3/uL (ref 0.7–4.0)
MCH: 28.7 pg (ref 26.0–34.0)
MCHC: 33.3 g/dL (ref 30.0–36.0)
MCV: 86.1 fL (ref 80.0–100.0)
Monocytes Absolute: 0.6 10*3/uL (ref 0.1–1.0)
Monocytes Relative: 8 %
Neutro Abs: 4.4 10*3/uL (ref 1.7–7.7)
Neutrophils Relative %: 55 %
Platelets: 161 10*3/uL (ref 150–400)
RBC: 5.05 MIL/uL (ref 4.22–5.81)
RDW: 13.7 % (ref 11.5–15.5)
WBC: 7.8 10*3/uL (ref 4.0–10.5)
nRBC: 0 % (ref 0.0–0.2)

## 2021-03-13 LAB — LIPASE, BLOOD: Lipase: 95 U/L — ABNORMAL HIGH (ref 11–51)

## 2021-03-13 LAB — PHOSPHORUS: Phosphorus: 2.8 mg/dL (ref 2.5–4.6)

## 2021-03-13 LAB — MAGNESIUM: Magnesium: 1.9 mg/dL (ref 1.7–2.4)

## 2021-03-13 MED ORDER — ONDANSETRON 4 MG PO TBDP: 4.0000 mg | ORAL_TABLET | Freq: Three times a day (TID) | ORAL | 0 refills | Status: AC | PRN

## 2021-03-13 MED ORDER — ACETAMINOPHEN 325 MG PO TABS: 650.0000 mg | ORAL_TABLET | Freq: Four times a day (QID) | ORAL | Status: AC | PRN

## 2021-03-13 NOTE — Progress Notes (Signed)
Pt ambulated to 4E14 to visit his wife, pt GCS 15 and appears in no distress

## 2021-03-13 NOTE — Discharge Summary (Signed)
Physician Discharge Summary  Scott Glover MVH:[id number] DOB: 1966-01-26 DOA: 03/10/2021  PCP: Default, Provider, MD  Admit date: 03/10/2021 Discharge date: 03/13/2021  Admitted From: Home Disposition: Home  Recommendations for Outpatient Follow-up:  Follow up with PCP in 1-2 weeks Please obtain liver function test in 1 week.  Home Health: Not applicable Equipment/Devices: Not applicable  Discharge Condition: Stable CODE STATUS: Full code Diet recommendation: Low-salt, low-fat and soft diet.  Discharge summary: Patient without any significant medical issues, history of pancreatitis about 25 years ago, 1 more bout of pancreatitis 10 years ago status postcholecystectomy presented with severe abdominal pain of 1 day, lipase of 2700.  Diagnosed with acute idiopathic pancreatitis.  Admitted to the hospital and treated with n.p.o., IV fluids and pain medications with very rapid clinical improvement.  MRCP with no evidence of choledocholithiasis or pancreatic anatomy abnormalities.  No alcohol use.  Triglycerides are normal.  A1c 6.3.  Acute idiopathic pancreatitis: Cause unknown.  Symptoms completely resolved.  Discharged with suggestions.  Tolerating regular diet.    Discharge Diagnoses:  Principal Problem:   Acute pancreatitis Active Problems:   Hyperglycemia    Discharge Instructions  Discharge Instructions     Call MD for:  persistant nausea and vomiting   Complete by: As directed    Call MD for:  severe uncontrolled pain   Complete by: As directed    Diet - low sodium heart healthy   Complete by: As directed    Increase activity slowly   Complete by: As directed       Allergies as of 03/13/2021   No Known Allergies      Medication List     TAKE these medications    acetaminophen 325 MG tablet Commonly known as: TYLENOL Take 2 tablets (650 mg total) by mouth every 6 (six) hours as needed for mild pain (or Fever >/= 101).   ondansetron 4 MG disintegrating  tablet Commonly known as: ZOFRAN-ODT Take 1 tablet (4 mg total) by mouth every 8 (eight) hours as needed for nausea or vomiting.        No Known Allergies  Consultations: None   Procedures/Studies: MR 3D Recon At Scanner  Result Date: 03/11/2021 CLINICAL DATA:  55 year old male with history of 1 day of severe abdominal pain, presenting with severe acute pancreatitis. EXAM: MRI ABDOMEN WITHOUT AND WITH CONTRAST (INCLUDING MRCP) TECHNIQUE: Multiplanar multisequence MR imaging of the abdomen was performed both before and after the administration of intravenous contrast. Heavily T2-weighted images of the biliary and pancreatic ducts were obtained, and three-dimensional MRCP images were rendered by post processing. CONTRAST:  18mL GADAVIST GADOBUTROL 1 MMOL/ML IV SOLN COMPARISON:  No prior abdominal MRI. Abdominal ultrasound 03/10/2021. FINDINGS: Lower chest: Unremarkable. Hepatobiliary: Mild diffuse loss of signal intensity throughout the hepatic parenchyma on out of phase dual echo images, indicative of a background of mild hepatic steatosis. No suspicious cystic or solid hepatic lesions. Status post cholecystectomy. No intrahepatic biliary ductal dilatation noted on MRCP images. Common bile duct is mildly dilated measuring 8 mm within the porta hepatis, which is within normal limits in this post cholecystectomy patient. No filling defects within the common bile duct to suggest choledocholithiasis. Pancreas: No pancreatic mass. No pancreatic ductal dilatation noted on MRCP images. Increased T2 signal intensity in the head and uncinate process of the pancreas where there are adjacent surrounding inflammatory changes and peripancreatic fluid, compatible with reported clinical history of acute pancreatitis. Pancreatic parenchyma enhances normally at this time. No well organized peripancreatic  fluid collection to indicate presence of a pseudocyst at this time. Spleen:  Unremarkable. Adrenals/Urinary Tract:  Small T1 hypointense, T2 hyperintense nonenhancing lesions are noted in both kidneys compatible with simple cysts, largest of which measures only 11 mm in the interpolar region of the left kidney. No other aggressive appearing renal lesions are noted. No hydroureteronephrosis in the visualized portions of the abdomen. Bilateral adrenal glands are normal in appearance. Stomach/Bowel: Inflammatory changes are noted adjacent to the duodenum, presumably secondary to acute pancreatitis. Otherwise, unremarkable. Vascular/Lymphatic: No significant atherosclerotic disease or aneurysm noted in the visualized abdominal vasculature. No lymphadenopathy Other: Small amount of fluid in the retroperitoneum centered around the pancreas. No significant volume of ascites noted in the visualized portions of the peritoneal cavity. Musculoskeletal: No aggressive appearing osseous lesions are noted in the visualized portions of the skeleton. IMPRESSION: 1. Imaging findings compatible with acute pancreatitis, as above. No complicating features are noted at this time. Specifically, no pancreatic necrosis or pseudocyst. 2. Status post cholecystectomy. Mild dilatation of the common bile duct is compatible with benign post cholecystectomy physiology. No choledocholithiasis. 3. Mild hepatic steatosis. Electronically Signed   By: Trudie Reed M.D.   On: 03/11/2021 09:06   MR ABDOMEN MRCP W WO CONTAST  Result Date: 03/11/2021 CLINICAL DATA:  55 year old male with history of 1 day of severe abdominal pain, presenting with severe acute pancreatitis. EXAM: MRI ABDOMEN WITHOUT AND WITH CONTRAST (INCLUDING MRCP) TECHNIQUE: Multiplanar multisequence MR imaging of the abdomen was performed both before and after the administration of intravenous contrast. Heavily T2-weighted images of the biliary and pancreatic ducts were obtained, and three-dimensional MRCP images were rendered by post processing. CONTRAST:  24mL GADAVIST GADOBUTROL 1 MMOL/ML  IV SOLN COMPARISON:  No prior abdominal MRI. Abdominal ultrasound 03/10/2021. FINDINGS: Lower chest: Unremarkable. Hepatobiliary: Mild diffuse loss of signal intensity throughout the hepatic parenchyma on out of phase dual echo images, indicative of a background of mild hepatic steatosis. No suspicious cystic or solid hepatic lesions. Status post cholecystectomy. No intrahepatic biliary ductal dilatation noted on MRCP images. Common bile duct is mildly dilated measuring 8 mm within the porta hepatis, which is within normal limits in this post cholecystectomy patient. No filling defects within the common bile duct to suggest choledocholithiasis. Pancreas: No pancreatic mass. No pancreatic ductal dilatation noted on MRCP images. Increased T2 signal intensity in the head and uncinate process of the pancreas where there are adjacent surrounding inflammatory changes and peripancreatic fluid, compatible with reported clinical history of acute pancreatitis. Pancreatic parenchyma enhances normally at this time. No well organized peripancreatic fluid collection to indicate presence of a pseudocyst at this time. Spleen:  Unremarkable. Adrenals/Urinary Tract: Small T1 hypointense, T2 hyperintense nonenhancing lesions are noted in both kidneys compatible with simple cysts, largest of which measures only 11 mm in the interpolar region of the left kidney. No other aggressive appearing renal lesions are noted. No hydroureteronephrosis in the visualized portions of the abdomen. Bilateral adrenal glands are normal in appearance. Stomach/Bowel: Inflammatory changes are noted adjacent to the duodenum, presumably secondary to acute pancreatitis. Otherwise, unremarkable. Vascular/Lymphatic: No significant atherosclerotic disease or aneurysm noted in the visualized abdominal vasculature. No lymphadenopathy Other: Small amount of fluid in the retroperitoneum centered around the pancreas. No significant volume of ascites noted in the  visualized portions of the peritoneal cavity. Musculoskeletal: No aggressive appearing osseous lesions are noted in the visualized portions of the skeleton. IMPRESSION: 1. Imaging findings compatible with acute pancreatitis, as above. No complicating features are noted  at this time. Specifically, no pancreatic necrosis or pseudocyst. 2. Status post cholecystectomy. Mild dilatation of the common bile duct is compatible with benign post cholecystectomy physiology. No choledocholithiasis. 3. Mild hepatic steatosis. Electronically Signed   By: Trudie Reed M.D.   On: 03/11/2021 09:06   US Abdomen Limited RUQ (LIVER/GB)  Result Date: 03/10/2021 CLINICAL DATA:  Pancreatitis. EXAM: ULTRASOUND ABDOMEN LIMITED RIGHT UPPER QUADRANT COMPARISON:  None. FINDINGS: Gallbladder: Gallbladder is surgically absent. Common bile duct: Diameter: 8.6 mm. Liver: No focal lesion identified. Within normal limits in parenchymal echogenicity. Portal vein is patent on color Doppler imaging with normal direction of blood flow towards the liver. Other: None. IMPRESSION: 1. Cholecystectomy. The common bile duct is dilated. This may be within normal limits, but correlation with lab values recommended to exclude distal biliary obstruction. Consider further evaluation with MRCP or ERCP as clinically warranted. Electronically Signed   By: Darliss Cheney M.D.   On: 03/10/2021 20:35   (Echo, Carotid, EGD, Colonoscopy, ERCP)    Subjective: Patient seen and examined.  Was given regular diet in the morning and ate well.  Walking around.  Denies any nausea vomiting.  Very mild epigastric pain present but no other issues.  Normal bowel movements earlier today.   Discharge Exam: Vitals:   03/12/21 2340 03/13/21 0532  BP: 112/77 134/89  Pulse: 74 72  Resp: 16   Temp: 98 F (36.7 C) 97.9 F (36.6 C)  SpO2: 99% 100%   Vitals:   03/12/21 1223 03/12/21 2340 03/13/21 0500 03/13/21 0532  BP: 125/75 112/77  134/89  Pulse: 71 74  72  Resp:  16 16    Temp: 98 F (36.7 C) 98 F (36.7 C)  97.9 F (36.6 C)  TempSrc: Oral Oral  Oral  SpO2: 100% 99%  100%  Weight:   110 kg     General: Pt is alert, awake, not in acute distress Cardiovascular: RRR, S1/S2 +, no rubs, no gallops Respiratory: CTA bilaterally, no wheezing, no rhonchi Abdominal: Soft, NT, ND, bowel sounds + Extremities: no edema, no cyanosis    The results of significant diagnostics from this hospitalization (including imaging, microbiology, ancillary and laboratory) are listed below for reference.     Microbiology: Recent Results (from the past 240 hour(s))  Resp Panel by RT-PCR (Flu A&B, Covid) Nasopharyngeal Swab     Status: None   Collection Time: 03/10/21  7:38 PM   Specimen: Nasopharyngeal Swab; Nasopharyngeal(NP) swabs in vial transport medium  Result Value Ref Range Status   SARS Coronavirus 2 by RT PCR NEGATIVE NEGATIVE Final    Comment: (NOTE) SARS-CoV-2 target nucleic acids are NOT DETECTED.  The SARS-CoV-2 RNA is generally detectable in upper respiratory specimens during the acute phase of infection. The lowest concentration of SARS-CoV-2 viral copies this assay can detect is 138 copies/mL. A negative result does not preclude SARS-Cov-2 infection and should not be used as the sole basis for treatment or other patient management decisions. A negative result may occur with  improper specimen collection/handling, submission of specimen other than nasopharyngeal swab, presence of viral mutation(s) within the areas targeted by this assay, and inadequate number of viral copies(<138 copies/mL). A negative result must be combined with clinical observations, patient history, and epidemiological information. The expected result is Negative.  Fact Sheet for Patients:  BloggerCourse.com  Fact Sheet for Healthcare Providers:  SeriousBroker.it  This test is no t yet approved or cleared by the [place] and  has been authorized for  detection and/or diagnosis of SARS-CoV-2 by FDA under an Emergency Use Authorization (EUA). This EUA will remain  in effect (meaning this test can be used) for the duration of the COVID-19 declaration under Section 564(b)(1) of the Act, 21 U.S.C.section 360bbb-3(b)(1), unless the authorization is terminated  or revoked sooner.       Influenza A by PCR NEGATIVE NEGATIVE Final   Influenza B by PCR NEGATIVE NEGATIVE Final    Comment: (NOTE) The Xpert Xpress SARS-CoV-2/FLU/RSV plus assay is intended as an aid in the diagnosis of influenza from Nasopharyngeal swab specimens and should not be used as a sole basis for treatment. Nasal washings and aspirates are unacceptable for Xpert Xpress SARS-CoV-2/FLU/RSV testing.  Fact Sheet for Patients: BloggerCourse.com  Fact Sheet for Healthcare Providers: SeriousBroker.it  This test is not yet approved or cleared by the [place] FDA and has been authorized for detection and/or diagnosis of SARS-CoV-2 by FDA under an Emergency Use Authorization (EUA). This EUA will remain in effect (meaning this test can be used) for the duration of the COVID-19 declaration under Section 564(b)(1) of the Act, 21 U.S.C. section 360bbb-3(b)(1), unless the authorization is terminated or revoked.  Performed at [facility], [address] [place], [place] [address]      Labs: BNP (last 3 results) No results for input(s): BNP in the last 8760 hours. Basic Metabolic Panel: Recent Labs  Lab 03/10/21 1149 03/11/21 0326 03/12/21 0505 03/13/21 0502  NA 139 138 134* 136  K 3.6 4.0 3.7 4.4  CL 107 104 104 107  CO2 20* 27 21* 22  GLUCOSE 154* 134* 84 114*  BUN 11 9 10 10   CREATININE 1.11 1.06 1.07 1.01  CALCIUM 9.3 8.9 8.5* 8.4*  MG  --   --   --  1.9  PHOS  --   --   --  2.8   Liver Function Tests: Recent Labs  Lab 03/10/21 1149 03/11/21 0326  03/12/21 0505 03/13/21 0502  AST 87* 439* 89* 43*  ALT 44 345* 191* 131*  ALKPHOS 42 49 48 43  BILITOT 1.1 1.8* 1.6* 1.5*  PROT 7.1 6.2* 6.1* 6.3*  ALBUMIN 3.9 3.3* 3.1* 3.3*   Recent Labs  Lab 03/10/21 1149 03/11/21 0326 03/12/21 0505 03/13/21 0502  LIPASE 2,723* 547* 109* 95*   No results for input(s): AMMONIA in the last 168 hours. CBC: Recent Labs  Lab 03/10/21 1149 03/11/21 0326 03/13/21 0502  WBC 10.4 7.7 7.8  NEUTROABS 4.4 5.5 4.4  HGB 15.0 14.2 14.5  HCT 43.8 42.7 43.5  MCV 85.5 86.8 86.1  PLT 202 181 161   Cardiac Enzymes: No results for input(s): CKTOTAL, CKMB, CKMBINDEX, TROPONINI in the last 168 hours. BNP: Invalid input(s): POCBNP CBG: No results for input(s): GLUCAP in the last 168 hours. D-Dimer No results for input(s): DDIMER in the last 72 hours. Hgb A1c Recent Labs    03/11/21 0326  HGBA1C 6.3*   Lipid Profile Recent Labs    03/11/21 0326  CHOL 136  HDL 57  LDLCALC 74  TRIG 23  CHOLHDL 2.4   Thyroid function studies No results for input(s): TSH, T4TOTAL, T3FREE, THYROIDAB in the last 72 hours.  Invalid input(s): FREET3 Anemia work up No results for input(s): VITAMINB12, FOLATE, FERRITIN, TIBC, IRON, RETICCTPCT in the last 72 hours. Urinalysis    Component Value Date/Time   COLORURINE AMBER (A) 03/11/2021 0325   APPEARANCEUR CLEAR 03/11/2021 0325   LABSPEC 1.019 03/11/2021 0325   PHURINE 7.0  03/11/2021 0325   GLUCOSEU NEGATIVE 03/11/2021 0325   HGBUR NEGATIVE 03/11/2021 0325   BILIRUBINUR NEGATIVE 03/11/2021 0325   KETONESUR 5 (A) 03/11/2021 0325   PROTEINUR NEGATIVE 03/11/2021 0325   NITRITE NEGATIVE 03/11/2021 0325   LEUKOCYTESUR NEGATIVE 03/11/2021 0325   Sepsis Labs Invalid input(s): PROCALCITONIN,  WBC,  LACTICIDVEN Microbiology Recent Results (from the past 240 hour(s))  Resp Panel by RT-PCR (Flu A&B, Covid) Nasopharyngeal Swab     Status: None   Collection Time: 03/10/21  7:38 PM   Specimen: Nasopharyngeal  Swab; Nasopharyngeal(NP) swabs in vial transport medium  Result Value Ref Range Status   SARS Coronavirus 2 by RT PCR NEGATIVE NEGATIVE Final    Comment: (NOTE) SARS-CoV-2 target nucleic acids are NOT DETECTED.  The SARS-CoV-2 RNA is generally detectable in upper respiratory specimens during the acute phase of infection. The lowest concentration of SARS-CoV-2 viral copies this assay can detect is 138 copies/mL. A negative result does not preclude SARS-Cov-2 infection and should not be used as the sole basis for treatment or other patient management decisions. A negative result may occur with  improper specimen collection/handling, submission of specimen other than nasopharyngeal swab, presence of viral mutation(s) within the areas targeted by this assay, and inadequate number of viral copies(<138 copies/mL). A negative result must be combined with clinical observations, patient history, and epidemiological information. The expected result is Negative.  Fact Sheet for Patients:  BloggerCourse.com  Fact Sheet for Healthcare Providers:  SeriousBroker.it  This test is no t yet approved or cleared by the [place] FDA and  has been authorized for detection and/or diagnosis of SARS-CoV-2 by FDA under an Emergency Use Authorization (EUA). This EUA will remain  in effect (meaning this test can be used) for the duration of the COVID-19 declaration under Section 564(b)(1) of the Act, 21 U.S.C.section 360bbb-3(b)(1), unless the authorization is terminated  or revoked sooner.       Influenza A by PCR NEGATIVE NEGATIVE Final   Influenza B by PCR NEGATIVE NEGATIVE Final    Comment: (NOTE) The Xpert Xpress SARS-CoV-2/FLU/RSV plus assay is intended as an aid in the diagnosis of influenza from Nasopharyngeal swab specimens and should not be used as a sole basis for treatment. Nasal washings and aspirates are unacceptable for Xpert Xpress  SARS-CoV-2/FLU/RSV testing.  Fact Sheet for Patients: BloggerCourse.com  Fact Sheet for Healthcare Providers: SeriousBroker.it  This test is not yet approved or cleared by the [place] FDA and has been authorized for detection and/or diagnosis of SARS-CoV-2 by FDA under an Emergency Use Authorization (EUA). This EUA will remain in effect (meaning this test can be used) for the duration of the COVID-19 declaration under Section 564(b)(1) of the Act, 21 U.S.C. section 360bbb-3(b)(1), unless the authorization is terminated or revoked.  Performed at [facility], [address] [place], [place] [address]      Time coordinating discharge:  28 minutes  SIGNED:   Dorcas Carrow, MD  [facility] 03/13/2021, 11:22 AM

## 2022-02-09 ENCOUNTER — Other Ambulatory Visit: Payer: Self-pay | Admitting: Physician Assistant

## 2022-02-09 ENCOUNTER — Ambulatory Visit
Admission: RE | Admit: 2022-02-09 | Discharge: 2022-02-09 | Disposition: A | Discharge status: Home / Self Care | Payer: Commercial Managed Care - PPO | Source: Ambulatory Visit | Attending: Physician Assistant | Admitting: Physician Assistant

## 2022-02-09 ENCOUNTER — Other Ambulatory Visit (HOSPITAL_COMMUNITY): Payer: Self-pay | Admitting: Physician Assistant

## 2022-02-09 DIAGNOSIS — M79671 Pain in right foot: Secondary | ICD-10-CM

## 2023-02-18 IMAGING — MR MR ABDOMEN WO/W CM MRCP
20 of 26 series · 42 of 48 positions shown · IV contrast (gadavist)
Comparison: No prior abdominal MRI. Abdominal ultrasound
03/10/2021.

CLINICAL DATA: 55-year-old male with history of 1 day of severe
abdominal pain, presenting with severe acute pancreatitis.

EXAM:
MRI ABDOMEN WITHOUT AND WITH CONTRAST (INCLUDING MRCP)
TECHNIQUE: Multiplanar multisequence MR imaging of the abdomen was performed
both before and after the administration of intravenous contrast.
Heavily T2-weighted images of the biliary and pancreatic ducts were
obtained, and three-dimensional MRCP images were rendered by post
processing.
CONTRAST:  10mL GADAVIST GADOBUTROL 1 MMOL/ML IV SOLN

[Series 4: ax haste · axial · 6.0mm · 1.31mm/px · 1 of 30 slices shown]
[im 1/30]
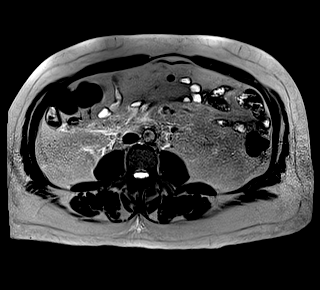

[Series 5: cor haste · coronal · 6.0mm · 1.31mm/px · 1 of 37 slices shown]
[im 1/37]
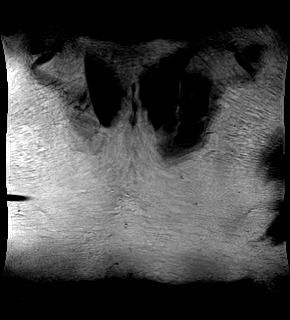

[Series 8: T2 fat-sat · axial · 6.0mm · 1.19mm/px · 1 of 5 slices shown (1 of 2)]
[im 1/5]
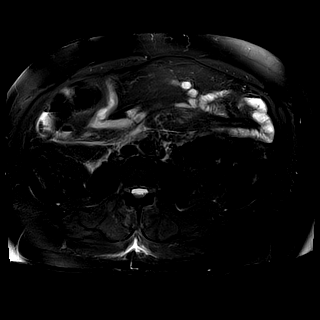

[Series 9: T2 fat-sat · axial · 6.0mm · 1.19mm/px · 1 of 30 slices shown (2 of 2)]
[im 1/30]
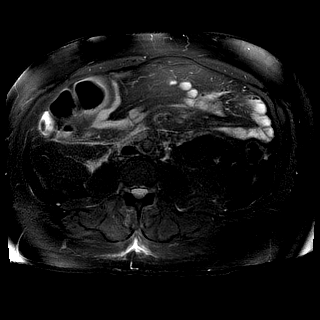

[Series 12: DWI · axial · 6.0mm · 1.57mm/px · z∈[-96,+127]mm · 2 of 96 slices shown (1 of 2)]
[im 1/96]
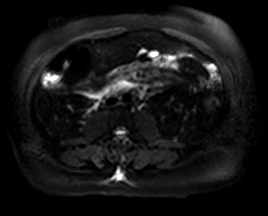
[im 96/96]
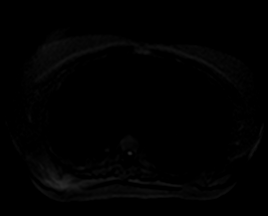

[Series 13: DWI · axial · 6.0mm · 1.57mm/px · 1 of 32 slices shown (2 of 2)]
[im 1/32]
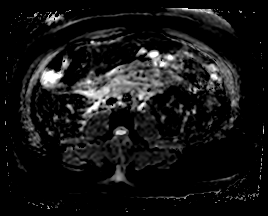

[Series 14: ax in and · axial · 3.0mm · 1.79mm/px · z∈[-89,+124]mm · 2 of 72 slices shown (1 of 2)]
[im 1/72]
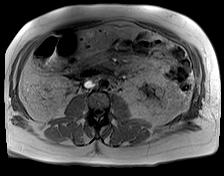
[im 72/72]
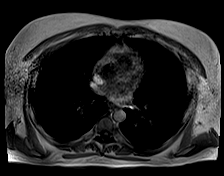

[Series 14: ax in and · axial · 3.0mm · 1.79mm/px · z∈[-89,+124]mm · 2 of 72 slices shown (2 of 2)]
[im 1/72]
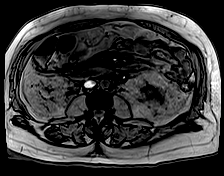
[im 72/72]
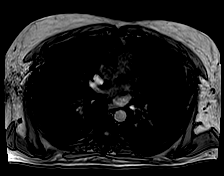

[Series 15: MRCP · coronal · 4.0mm · 1.12mm/px · 1 of 15 slices shown]
[im 1/15]
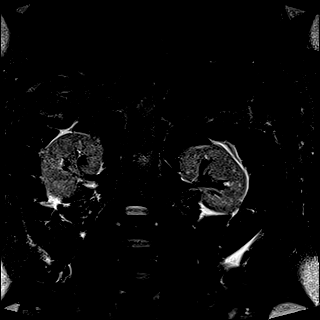

[Series 16: radials · coronal · 50.0mm · 0.78mm/px · 1 of 5 slices shown]
[im 1/5]
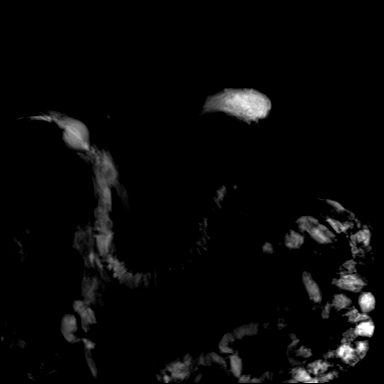

[Series 17: T1 dynamic · axial · non-contrast · 3.0mm · 1.79mm/px · z∈[-89,+124]mm · 2 of 72 slices shown (1 of 5)]
[im 1/72]
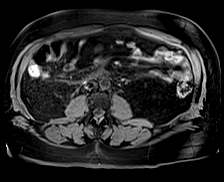
[im 72/72]
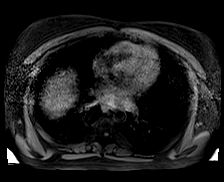

[Series 19: T1 dynamic post-contrast · axial · 3.0mm · 1.79mm/px · z∈[-89,+124]mm · 3 of 72 slices shown (1 of 5)]
[im 1/72]
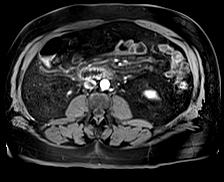
[im 36/72]
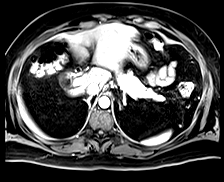
[im 72/72]
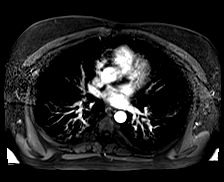

[Series 20: T1 dynamic · axial · 3.0mm · 1.79mm/px · z∈[-89,+124]mm · 3 of 72 slices shown (2 of 5)]
[im 1/72]
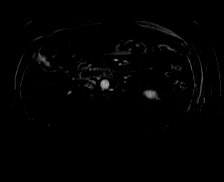
[im 36/72]
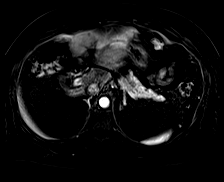
[im 72/72]
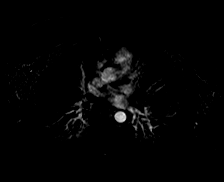

[Series 21: T1 dynamic post-contrast · axial · 3.0mm · 1.79mm/px · z∈[-89,+124]mm · 3 of 72 slices shown (2 of 5)]
[im 1/72]
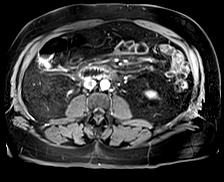
[im 36/72]
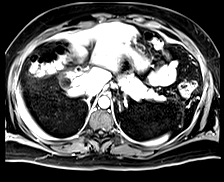
[im 72/72]
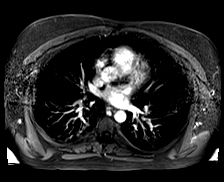

[Series 22: T1 dynamic · axial · 3.0mm · 1.79mm/px · z∈[-89,+124]mm · 3 of 72 slices shown (3 of 5)]
[im 1/72]
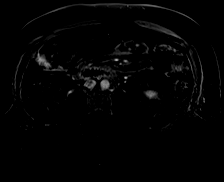
[im 36/72]
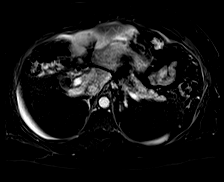
[im 72/72]
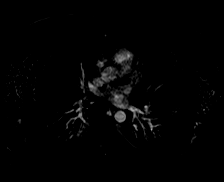

[Series 23: T1 dynamic post-contrast · axial · 3.0mm · 1.79mm/px · z∈[-89,+124]mm · 3 of 72 slices shown (3 of 5)]
[im 1/72]
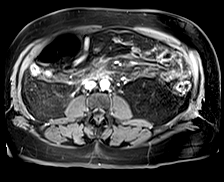
[im 36/72]
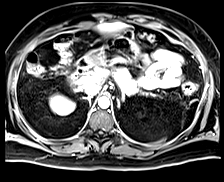
[im 72/72]
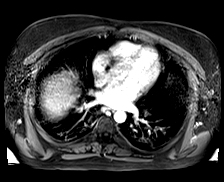

[Series 24: T1 dynamic · axial · 3.0mm · 1.79mm/px · z∈[-89,+124]mm · 3 of 72 slices shown (4 of 5)]
[im 1/72]
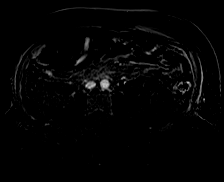
[im 36/72]
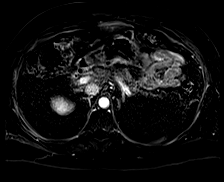
[im 72/72]
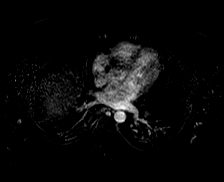

[Series 25: T1 dynamic post-contrast · axial · 3.0mm · 1.79mm/px · z∈[-89,+124]mm · 3 of 72 slices shown (4 of 5)]
[im 1/72]
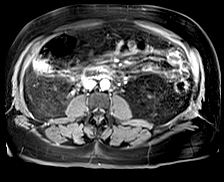
[im 36/72]
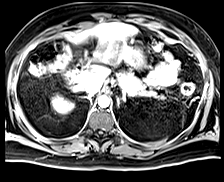
[im 72/72]
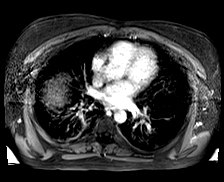

[Series 26: T1 dynamic · axial · 3.0mm · 1.79mm/px · z∈[-89,+124]mm · 3 of 72 slices shown (5 of 5)]
[im 1/72]
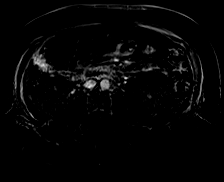
[im 36/72]
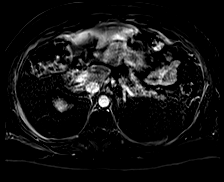
[im 72/72]
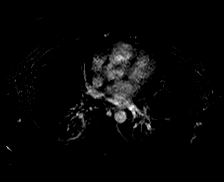

[Series 27: T1 dynamic post-contrast · coronal · 3.0mm · 1.88mm/px · 3 of 72 slices shown (5 of 5)]
[im 1/72]
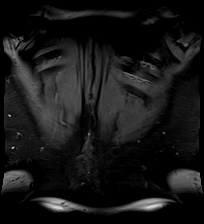
[im 36/72]
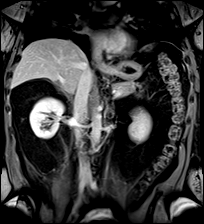
[im 72/72]
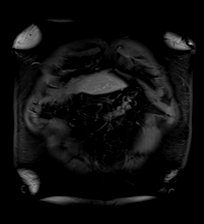

[42 of 48 positions shown; findings below may reference images not displayed]

FINDINGS: Lower chest: Unremarkable.

Hepatobiliary: Mild diffuse loss of signal intensity throughout the
hepatic parenchyma on out of phase dual echo images, indicative of a
background of mild hepatic steatosis. No suspicious cystic or solid
hepatic lesions. Status post cholecystectomy. No intrahepatic
biliary ductal dilatation noted on MRCP images. Common bile duct is
mildly dilated measuring 8 mm within the porta hepatis, which is
within normal limits in this post cholecystectomy patient. No
filling defects within the common bile duct to suggest
choledocholithiasis.

Pancreas: No pancreatic mass. No pancreatic ductal dilatation noted
on MRCP images. Increased T2 signal intensity in the head and
uncinate process of the pancreas where there are adjacent
surrounding inflammatory changes and peripancreatic fluid,
compatible with reported clinical history of acute pancreatitis.
Pancreatic parenchyma enhances normally at this time. No well
organized peripancreatic fluid collection to indicate presence of a
pseudocyst at this time.

Spleen:  Unremarkable.

Adrenals/Urinary Tract: Small T1 hypointense, T2 hyperintense
nonenhancing lesions are noted in both kidneys compatible with
simple cysts, largest of which measures only 11 mm in the interpolar
region of the left kidney. No other aggressive appearing renal
lesions are noted. No hydroureteronephrosis in the visualized
portions of the abdomen. Bilateral adrenal glands are normal in
appearance.

Stomach/Bowel: Inflammatory changes are noted adjacent to the
duodenum, presumably secondary to acute pancreatitis. Otherwise,
unremarkable.

Vascular/Lymphatic: No significant atherosclerotic disease or
aneurysm noted in the visualized abdominal vasculature. No
lymphadenopathy

Other: Small amount of fluid in the retroperitoneum centered around
the pancreas. No significant volume of ascites noted in the
visualized portions of the peritoneal cavity.

Musculoskeletal: No aggressive appearing osseous lesions are noted
in the visualized portions of the skeleton.
IMPRESSION: 1. Imaging findings compatible with acute pancreatitis, as above. No
complicating features are noted at this time. Specifically, no
pancreatic necrosis or pseudocyst.
2. Status post cholecystectomy. Mild dilatation of the common bile
duct is compatible with benign post cholecystectomy physiology. No
choledocholithiasis.
3. Mild hepatic steatosis.
